# Patient Record
Sex: Female | Born: 1985 | Race: White | Hispanic: No | Smoking: Former smoker
Health system: Southern US, Community
[De-identification: ages and names within clinical notes are randomized; demographics above are authoritative.]

---

## 2015-05-18 ENCOUNTER — Emergency Department (HOSPITAL_COMMUNITY): Payer: No Typology Code available for payment source

## 2015-05-18 ENCOUNTER — Emergency Department (HOSPITAL_COMMUNITY)
Admission: EM | Admit: 2015-05-18 | Discharge: 2015-05-18 | Disposition: A | Discharge status: Home / Self Care | Payer: No Typology Code available for payment source | Attending: Emergency Medicine | Admitting: Emergency Medicine

## 2015-05-18 ENCOUNTER — Encounter (HOSPITAL_COMMUNITY): Payer: Self-pay | Admitting: Emergency Medicine

## 2015-05-18 DIAGNOSIS — S62625A Displaced fracture of medial phalanx of left ring finger, initial encounter for closed fracture: Secondary | ICD-10-CM | POA: Diagnosis not present

## 2015-05-18 DIAGNOSIS — Y998 Other external cause status: Secondary | ICD-10-CM | POA: Insufficient documentation

## 2015-05-18 DIAGNOSIS — Z79899 Other long term (current) drug therapy: Secondary | ICD-10-CM | POA: Insufficient documentation

## 2015-05-18 DIAGNOSIS — Y92410 Unspecified street and highway as the place of occurrence of the external cause: Secondary | ICD-10-CM | POA: Insufficient documentation

## 2015-05-18 DIAGNOSIS — S62609A Fracture of unspecified phalanx of unspecified finger, initial encounter for closed fracture: Secondary | ICD-10-CM

## 2015-05-18 DIAGNOSIS — S50812A Abrasion of left forearm, initial encounter: Secondary | ICD-10-CM | POA: Insufficient documentation

## 2015-05-18 DIAGNOSIS — Z91040 Latex allergy status: Secondary | ICD-10-CM | POA: Insufficient documentation

## 2015-05-18 DIAGNOSIS — S29001A Unspecified injury of muscle and tendon of front wall of thorax, initial encounter: Secondary | ICD-10-CM | POA: Diagnosis not present

## 2015-05-18 DIAGNOSIS — S60042A Contusion of left ring finger without damage to nail, initial encounter: Secondary | ICD-10-CM | POA: Diagnosis not present

## 2015-05-18 DIAGNOSIS — Y9389 Activity, other specified: Secondary | ICD-10-CM | POA: Insufficient documentation

## 2015-05-18 DIAGNOSIS — S6992XA Unspecified injury of left wrist, hand and finger(s), initial encounter: Secondary | ICD-10-CM | POA: Diagnosis present

## 2015-05-18 MED ORDER — CYCLOBENZAPRINE HCL 10 MG PO TABS: 10.0000 mg | ORAL_TABLET | Freq: Two times a day (BID) | ORAL | Status: DC | PRN

## 2015-05-18 MED ORDER — OXYCODONE-ACETAMINOPHEN 5-325 MG PO TABS
1.0000 | ORAL_TABLET | ORAL | Status: AC | PRN
  Administered 2015-05-18 (×2): 1 via ORAL
  Filled 2015-05-18 (×2): qty 1

## 2015-05-18 MED ORDER — TRAMADOL HCL 50 MG PO TABS: 50.0000 mg | ORAL_TABLET | Freq: Four times a day (QID) | ORAL | Status: DC | PRN

## 2015-05-18 NOTE — Discharge Instructions (Signed)
Please return immediately if you experience any worsening signs or symptoms, please follow up with hand surgery for reevaluation and further management.  Finger Fracture Fractures of fingers are breaks in the bones of the fingers. There are many types of fractures. There are different ways of treating these fractures. Your health care provider will discuss the best way to treat your fracture. CAUSES Traumatic injury is the main cause of broken fingers. These include:  Injuries while playing sports.  Workplace injuries.  Falls. RISK FACTORS Activities that can increase your risk of finger fractures include:  Sports.  Workplace activities that involve machinery.  A condition called osteoporosis, which can make your bones less dense and cause them to fracture more easily. SIGNS AND SYMPTOMS The main symptoms of a broken finger are pain and swelling within 15 minutes after the injury. Other symptoms include:  Bruising of your finger.  Stiffness of your finger.  Numbness of your finger.  Exposed bones (compound fracture) if the fracture is severe. DIAGNOSIS  The best way to diagnose a broken bone is with X-ray imaging. Additionally, your health care provider will use this X-ray image to evaluate the position of the broken finger bones.  TREATMENT  Finger fractures can be treated with:   Nonreduction--This means the bones are in place. The finger is splinted without changing the positions of the bone pieces. The splint is usually left on for about a week to 10 days. This will depend on your fracture and what your health care provider thinks.  Closed reduction--The bones are put back into position without using surgery. The finger is then splinted.  Open reduction and internal fixation--The fracture site is opened. Then the bone pieces are fixed into place with pins or some type of hardware. This is seldom required. It depends on the severity of the fracture. HOME CARE INSTRUCTIONS     Follow your health care provider's instructions regarding activities, exercises, and physical therapy.  Only take over-the-counter or prescription medicines for pain, discomfort, or fever as directed by your health care provider. SEEK MEDICAL CARE IF: You have pain or swelling that limits the motion or use of your fingers. SEEK IMMEDIATE MEDICAL CARE IF:  Your finger becomes numb. MAKE SURE YOU:   Understand these instructions.  Will watch your condition.  Will get help right away if you are not doing well or get worse.   This information is not intended to replace advice given to you by your health care provider. Make sure you discuss any questions you have with your health care provider.   Document Released: 04/21/2000 Document Revised: 10/28/2012 Document Reviewed: 08/19/2012 Elsevier Interactive Patient Education [employer]2016 [employer] Inc.
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
## 2015-08-02 DIAGNOSIS — S62655D Nondisplaced fracture of medial phalanx of left ring finger, subsequent encounter for fracture with routine healing: Secondary | ICD-10-CM | POA: Insufficient documentation

## 2016-04-18 ENCOUNTER — Ambulatory Visit: Payer: Self-pay | Admitting: Adult Health

## 2016-04-20 ENCOUNTER — Emergency Department (HOSPITAL_COMMUNITY)
Admission: EM | Admit: 2016-04-20 | Discharge: 2016-04-20 | Disposition: A | Discharge status: Home / Self Care | Payer: Managed Care, Other (non HMO) | Attending: Emergency Medicine | Admitting: Emergency Medicine

## 2016-04-20 ENCOUNTER — Encounter (HOSPITAL_COMMUNITY): Payer: Self-pay

## 2016-04-20 ENCOUNTER — Emergency Department (HOSPITAL_COMMUNITY): Payer: Managed Care, Other (non HMO)

## 2016-04-20 DIAGNOSIS — X58XXXA Exposure to other specified factors, initial encounter: Secondary | ICD-10-CM | POA: Insufficient documentation

## 2016-04-20 DIAGNOSIS — Z91040 Latex allergy status: Secondary | ICD-10-CM | POA: Insufficient documentation

## 2016-04-20 DIAGNOSIS — Z7982 Long term (current) use of aspirin: Secondary | ICD-10-CM | POA: Insufficient documentation

## 2016-04-20 DIAGNOSIS — Y999 Unspecified external cause status: Secondary | ICD-10-CM | POA: Diagnosis not present

## 2016-04-20 DIAGNOSIS — Z79899 Other long term (current) drug therapy: Secondary | ICD-10-CM | POA: Insufficient documentation

## 2016-04-20 DIAGNOSIS — Y929 Unspecified place or not applicable: Secondary | ICD-10-CM | POA: Diagnosis not present

## 2016-04-20 DIAGNOSIS — Y939 Activity, unspecified: Secondary | ICD-10-CM | POA: Insufficient documentation

## 2016-04-20 DIAGNOSIS — S39012A Strain of muscle, fascia and tendon of lower back, initial encounter: Secondary | ICD-10-CM | POA: Diagnosis not present

## 2016-04-20 DIAGNOSIS — S3992XA Unspecified injury of lower back, initial encounter: Secondary | ICD-10-CM | POA: Diagnosis present

## 2016-04-20 MED ORDER — CYCLOBENZAPRINE HCL 10 MG PO TABS: 10.0000 mg | ORAL_TABLET | Freq: Two times a day (BID) | ORAL | 0 refills | Status: DC | PRN

## 2016-04-20 MED ORDER — METHOCARBAMOL 500 MG PO TABS
500.0000 mg | ORAL_TABLET | Freq: Once | ORAL | Status: AC
Start: 2016-04-20 — End: 2016-04-20
  Administered 2016-04-20: 500 mg via ORAL
  Filled 2016-04-20: qty 1

## 2016-04-20 MED ORDER — KETOROLAC TROMETHAMINE 60 MG/2ML IM SOLN
30.0000 mg | Freq: Once | INTRAMUSCULAR | Status: AC
  Administered 2016-04-20: 30 mg via INTRAMUSCULAR
  Filled 2016-04-20: qty 2

## 2016-04-20 MED ORDER — LIDOCAINE 5 % EX PTCH: 1.0000 | MEDICATED_PATCH | CUTANEOUS | 0 refills | Status: DC

## 2016-04-20 MED ORDER — DICLOFENAC SODIUM 50 MG PO TBEC: 50.0000 mg | DELAYED_RELEASE_TABLET | Freq: Two times a day (BID) | ORAL | 0 refills | Status: DC

## 2016-04-20 MED ORDER — TRAMADOL HCL 50 MG PO TABS
50.0000 mg | ORAL_TABLET | Freq: Four times a day (QID) | ORAL | 0 refills | Status: DC | PRN
Start: 2016-04-20 — End: 2016-06-27

## 2016-04-20 MED ORDER — OXYCODONE-ACETAMINOPHEN 5-325 MG PO TABS
1.0000 | ORAL_TABLET | Freq: Once | ORAL | Status: AC
  Administered 2016-04-20: 1 via ORAL
  Filled 2016-04-20: qty 1

## 2016-04-20 NOTE — Discharge Instructions (Signed)
Do not take the narcotic or muscle relaxant if driving because they will make you sleepy. Follow up with Dr. Eulah Pont. Return here as needed.

## 2016-04-20 NOTE — ED Triage Notes (Signed)
She states she felt sudden low back pain upon bending to look into a cabinet this morning. She states she is otherwise healthy.
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
## 2016-04-23 ENCOUNTER — Other Ambulatory Visit: Payer: Self-pay

## 2016-04-24 ENCOUNTER — Encounter: Payer: Self-pay | Admitting: Adult Health

## 2016-04-24 ENCOUNTER — Ambulatory Visit (INDEPENDENT_AMBULATORY_CARE_PROVIDER_SITE_OTHER): Payer: Managed Care, Other (non HMO) | Admitting: Adult Health

## 2016-04-24 VITALS — BP 133/71 | HR 67 | Ht 67.75 in | Wt >= 6400 oz

## 2016-04-24 DIAGNOSIS — L720 Epidermal cyst: Secondary | ICD-10-CM | POA: Diagnosis not present

## 2016-04-24 DIAGNOSIS — M549 Dorsalgia, unspecified: Secondary | ICD-10-CM | POA: Insufficient documentation

## 2016-04-24 DIAGNOSIS — R5383 Other fatigue: Secondary | ICD-10-CM | POA: Diagnosis not present

## 2016-04-24 DIAGNOSIS — Z0000 Encounter for general adult medical examination without abnormal findings: Secondary | ICD-10-CM | POA: Diagnosis not present

## 2016-04-24 DIAGNOSIS — M545 Low back pain: Secondary | ICD-10-CM | POA: Diagnosis not present

## 2016-04-24 NOTE — Assessment & Plan Note (Signed)
Has lost 8 lbs in the last 6 weeks due to reduction of sugar/saturated fat in diet. Current wt today 400# Slowly increase regular exercise into daily routine.

## 2016-04-24 NOTE — Assessment & Plan Note (Signed)
Continue daily stretching. Continue all treatment as directed by ED. Continue with Chiropractor. If pain persists/worsens, please call us for Orthopedic Referral.

## 2016-04-24 NOTE — Assessment & Plan Note (Signed)
Dermatology referral placed

## 2016-04-24 NOTE — Progress Notes (Signed)
Subjective:    Patient ID: Sandy Christensen, female    DOB: January 19, 1986, 31 y.o.   MRN: [id number]  HPI:  Sandy Christensen is here to establish as a new pt.  She is a very pleasant 31 year old female.  PMH:  Mobid Obesity, Cyst on left ear (ear pierced 2012, then cyst developed), chronic bil heel pain, and new onset acute back pain.  She was treated in ED over weekend for back pain and reports pain is 2/10 at present.  She has been taking medication as directed and plans on visiting personal chiropractor ASAP. Heel pain has been present "forever and hurts when I first walk in morning or after sitting for prolonged periods".  She recently eliminated soda from diet and reducing saturated fat/sugar/CHO intake.  She plans on increasing daily exercise after back pain resolves.  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 Her partner was at [facility] during encounter.   Patient Care Team    Relationship Specialty Notifications Start End  Malon Kindle, NP PCP - General Family Medicine  04/24/16     Patient Active Problem List   Diagnosis Date Noted  . Closed nondisplaced fracture of middle phalanx of left ring finger with routine healing 08/02/2015     No past medical history on file.   No past surgical history on file.   Family History  Problem Relation Age of Onset  . Hypertension Mother   . Cancer Father     skin  . Healthy Sister   . Healthy Brother   . Healthy Sister   . Healthy Sister   . Healthy Brother      History  Drug Use No     History  Alcohol Use  . Yes    Comment: socially     History  Smoking Status  . Former Smoker  . Packs/day: 0.50  . Years: 2.00  . Types: Cigarettes  . Quit date: 01/21/1998  Smokeless Tobacco  . Never Used     Outpatient Encounter Prescriptions as of 04/24/2016  Medication Sig  . Ascorbic Acid (VITAMIN C PO) Take 1 tablet by mouth daily.  Marland Kitchen aspirin-acetaminophen-caffeine (EXCEDRIN MIGRAINE)  250-250-65 MG tablet Take 2 tablets by mouth every 6 (six) hours as needed for headache.  . cetirizine (ZYRTEC) 10 MG tablet Take 10 mg by mouth daily.  . cyclobenzaprine (FLEXERIL) 10 MG tablet Take 1 tablet (10 mg total) by mouth 2 (two) times daily as needed for muscle spasms.  . diclofenac (VOLTAREN) 50 MG EC tablet Take 1 tablet (50 mg total) by mouth 2 (two) times daily.  . fluticasone (FLONASE) 50 MCG/ACT nasal spray Place 1 spray into both nostrils daily as needed for allergies or rhinitis.  Marland Kitchen lidocaine (LIDODERM) 5 % Place 1 patch onto the skin daily. Remove & Discard patch within 12 hours or as directed by MD  . Multiple Vitamins-Minerals (ECHINACEA ACZ PO) Take 1 tablet by mouth daily.  . polyvinyl alcohol (LIQUIFILM TEARS) 1.4 % ophthalmic solution Place 1 drop into both eyes 3 (three) times daily.  . RaNITidine HCl (ZANTAC PO) Take 1 tablet by mouth daily as needed (indigestion.).  Marland Kitchen traMADol (ULTRAM) 50 MG tablet Take 1 tablet (50 mg total) by mouth every 6 (six) hours as needed.   No facility-administered encounter medications on file as of 04/24/2016.     Allergies: Latex and Neosporin [neomycin-bacitracin zn-polymyx]  Body mass index is  61.3 kg/m.  Blood pressure 133/71, pulse 67, height 5' 7.75" (1.721 m), weight (!) 400 lb 3.2 oz (181.5 kg), last menstrual period 04/06/2016.   Review of Systems  Constitutional: Positive for fatigue. Negative for activity change, appetite change, chills, diaphoresis, fever and unexpected weight change.  HENT: Negative for congestion.   Eyes: Negative for visual disturbance.  Respiratory: Negative for cough, chest tightness, shortness of breath, wheezing and stridor.   Cardiovascular: Negative for chest pain, palpitations and leg swelling.  Gastrointestinal: Negative for abdominal distention, abdominal pain, blood in stool, constipation, diarrhea, nausea and vomiting.  Endocrine: Negative for cold intolerance, heat intolerance,  polydipsia, polyphagia and polyuria.  Genitourinary: Negative for difficulty urinating, flank pain and hematuria.  Musculoskeletal: Positive for arthralgias, back pain, gait problem, joint swelling and myalgias. Negative for neck pain and neck stiffness.       Denies bowel/bladder dysfunction. Denies numbness/tingling/change in sensation in lower extremities.   Skin: Negative for color change, pallor, rash and wound.  Allergic/Immunologic: Negative for immunocompromised state.  Neurological: Positive for headaches. Negative for dizziness, tremors, weakness and light-headedness.  Hematological: Does not bruise/bleed easily.  Psychiatric/Behavioral: Negative for agitation, dysphoric mood and sleep disturbance. The patient is not nervous/anxious.        Objective:   Physical Exam  Constitutional: She is oriented to person, place, and time. She appears well-developed and well-nourished. No distress.  HENT:  Head: Normocephalic and atraumatic.  Right Ear: Hearing, tympanic membrane and external ear normal.  Left Ear: Hearing and tympanic membrane normal.  Ears:  One epidermal cyst on top of left helix.   Eyes: Conjunctivae are normal. Pupils are equal, round, and reactive to light.  Neck: Normal range of motion. Neck supple.  Cardiovascular: Normal rate, regular rhythm, normal heart sounds and intact distal pulses.   No murmur heard. Pulmonary/Chest: Effort normal and breath sounds normal. No respiratory distress. She has no wheezes. She has no rales. She exhibits no tenderness.  Abdominal: Soft. Bowel sounds are normal. She exhibits no distension and no mass. There is no tenderness. There is no rebound and no guarding.  Protuberant abdomen.  Musculoskeletal: Normal range of motion. She exhibits edema and tenderness. She exhibits no deformity.       Right ankle: She exhibits swelling.       Left ankle: She exhibits swelling.       Lumbar back: She exhibits tenderness and spasm. She  exhibits normal range of motion, no swelling, no edema and no deformity.  Lymphadenopathy:    She has no cervical adenopathy.  Neurological: She is alert and oriented to person, place, and time. Coordination normal.  Skin: Skin is warm and dry. No rash noted. She is not diaphoretic. No erythema. No pallor.  Psychiatric: She has a normal mood and affect. Her behavior is normal. Judgment and thought content normal.  Nursing note and vitals reviewed.         Assessment & Plan:   1. Epidermal cyst of ear   2. Morbid obesity (HCC)   3. Acute bilateral low back pain, with sciatica presence unspecified   4. Health care maintenance     Back pain Continue daily stretching. Continue all treatment as directed by ED. Continue with Chiropractor. If pain persists/worsens, please call us for Orthopedic Referral.  Morbid obesity (HCC) Has lost 8 lbs in the last 6 weeks due to reduction of sugar/saturated fat in diet. Current wt today 400# Slowly increase regular exercise into daily routine.   Epidermal cyst  of ear Dermatology referral placed.  Health care maintenance Continue all medications as directed. Continue to improve diet-GREAT JOB! Increase regular movement, recommend at least 63mins/5 times week. Please schedule fasting lab nurse visit at your convenience. Follow-up in 6 months, sooner if needed    FOLLOW-UP:  Return in about 6 months (around 10/24/2016) for Regular Follow Up.

## 2016-04-24 NOTE — Assessment & Plan Note (Signed)
Continue all medications as directed. Continue to improve diet-GREAT JOB! Increase regular movement, recommend at least 52mins/5 times week. Please schedule fasting lab nurse visit at your convenience. Follow-up in 6 months, sooner if needed

## 2016-04-24 NOTE — Patient Instructions (Signed)
Heart-Healthy Eating Plan Many factors influence your heart health, including eating and exercise habits. Heart (coronary) risk increases with abnormal blood fat (lipid) levels. Heart-healthy meal planning includes limiting unhealthy fats, increasing healthy fats, and making other small dietary changes. This includes maintaining a healthy body weight to help keep lipid levels within a normal range. What is my plan? Your health care provider recommends that you:  Get no more than _________% of the total calories in your daily diet from fat.  Limit your intake of saturated fat to less than _________% of your total calories each day.  Limit the amount of cholesterol in your diet to less than _________ mg per day. What types of fat should I choose?  Choose healthy fats more often. Choose monounsaturated and polyunsaturated fats, such as olive oil and canola oil, flaxseeds, walnuts, almonds, and seeds.  Eat more omega-3 fats. Good choices include salmon, mackerel, sardines, tuna, flaxseed oil, and ground flaxseeds. Aim to eat fish at least two times each week.  Limit saturated fats. Saturated fats are primarily found in animal products, such as meats, butter, and cream. Plant sources of saturated fats include palm oil, palm kernel oil, and coconut oil.  Avoid foods with partially hydrogenated oils in them. These contain trans fats. Examples of foods that contain trans fats are stick margarine, some tub margarines, cookies, crackers, and other baked goods. What general guidelines do I need to follow?  Check food labels carefully to identify foods with trans fats or high amounts of saturated fat.  Fill one half of your plate with vegetables and green salads. Eat 4-5 servings of vegetables per day. A serving of vegetables equals 1 cup of raw leafy vegetables,  cup of raw or cooked cut-up vegetables, or  cup of vegetable juice.  Fill one fourth of your plate with whole grains. Look for the word  "whole" as the first word in the ingredient list.  Fill one fourth of your plate with lean protein foods.  Eat 4-5 servings of fruit per day. A serving of fruit equals one medium whole fruit,  cup of dried fruit,  cup of fresh, frozen, or canned fruit, or  cup of 100% fruit juice.  Eat more foods that contain soluble fiber. Examples of foods that contain this type of fiber are apples, broccoli, carrots, beans, peas, and barley. Aim to get 20-30 g of fiber per day.  Eat more home-cooked food and less restaurant, buffet, and fast food.  Limit or avoid alcohol.  Limit foods that are high in starch and sugar.  Avoid fried foods.  Cook foods by using methods other than frying. Baking, boiling, grilling, and broiling are all great options. Other fat-reducing suggestions include:  Removing the skin from poultry.  Removing all visible fats from meats.  Skimming the fat off of stews, soups, and gravies before serving them.  Steaming vegetables in water or broth.  Lose weight if you are overweight. Losing just 5-10% of your initial body weight can help your overall health and prevent diseases such as diabetes and heart disease.  Increase your consumption of nuts, legumes, and seeds to 4-5 servings per week. One serving of dried beans or legumes equals  cup after being cooked, one serving of nuts equals 1 ounces, and one serving of seeds equals  ounce or 1 tablespoon.  You may need to monitor your salt (sodium) intake, especially if you have high blood pressure. Talk with your health care provider or dietitian to get more  information about reducing sodium. What foods can I eat? Grains   Breads, including [place], white, pita, wheat, raisin, rye, oatmeal, and [place]. Tortillas that are neither fried nor made with lard or trans fat. Low-fat rolls, including hotdog and hamburger buns and English muffins. Biscuits. Muffins. Waffles. Pancakes. Light popcorn. Whole-grain cereals. Flatbread.  Melba toast. Pretzels. Breadsticks. Rusks. Low-fat snacks and crackers, including oyster, saltine, matzo, graham, animal, and rye. Rice and pasta, including [place] rice and those that are made with whole wheat. Vegetables  All vegetables. Fruits  All fruits, but limit coconut. Meats and Other Protein Sources  Lean, well-trimmed beef, veal, pork, and lamb. Chicken and [place] without skin. All fish and shellfish. Wild duck, rabbit, pheasant, and venison. Egg whites or low-cholesterol egg substitutes. Dried beans, peas, lentils, and tofu.Seeds and most nuts. Dairy  Low-fat or nonfat cheeses, including ricotta, string, and mozzarella. Skim or 1% milk that is liquid, powdered, or evaporated. Buttermilk that is made with low-fat milk. Nonfat or low-fat yogurt. Beverages  Mineral water. Diet carbonated beverages. Sweets and Desserts  Sherbets and fruit ices. Honey, jam, marmalade, jelly, and syrups. Meringues and gelatins. Pure sugar candy, such as hard candy, jelly beans, gumdrops, mints, marshmallows, and small amounts of dark chocolate. [employer] Eat all sweets and desserts in moderation. Fats and Oils  Nonhydrogenated (trans-free) margarines. Vegetable oils, including soybean, sesame, sunflower, olive, peanut, safflower, corn, canola, and cottonseed. Salad dressings or mayonnaise that are made with a vegetable oil. Limit added fats and oils that you use for cooking, baking, salads, and as spreads. Other  Cocoa powder. Coffee and tea. All seasonings and condiments. The items listed above may not be a complete list of recommended foods or beverages. Contact your dietitian for more options.  What foods are not recommended? Grains  Breads that are made with saturated or trans fats, oils, or whole milk. Croissants. Butter rolls. Cheese breads. Sweet rolls. Donuts. Buttered popcorn. Chow mein noodles. High-fat crackers, such as cheese or butter crackers. Meats and Other Protein Sources  Fatty  meats, such as hotdogs, short ribs, sausage, spareribs, bacon, ribeye roast or steak, and mutton. High-fat deli meats, such as salami and bologna. Caviar. Domestic duck and goose. Organ meats, such as kidney, liver, sweetbreads, brains, gizzard, chitterlings, and heart. Dairy  Cream, sour cream, cream cheese, and creamed cottage cheese. Whole milk cheeses, including blue (bleu), Monterey Jack, [place], [place], American, [place], Swiss, [place], [place], and [place]. Whole or 2% milk that is liquid, evaporated, or condensed. Whole buttermilk. Cream sauce or high-fat cheese sauce. Yogurt that is made from whole milk. Beverages  Regular sodas and drinks with added sugar. Sweets and Desserts  Frosting. Pudding. Cookies. Cakes other than angel food cake. Candy that has milk chocolate or white chocolate, hydrogenated fat, butter, coconut, or unknown ingredients. Buttered syrups. Full-fat ice cream or ice cream drinks. Fats and Oils  Gravy that has suet, meat fat, or shortening. Cocoa butter, hydrogenated oils, palm oil, coconut oil, palm kernel oil. These can often be found in baked products, candy, fried foods, nondairy creamers, and whipped toppings. Solid fats and shortenings, including bacon fat, salt pork, lard, and butter. Nondairy cream substitutes, such as coffee creamers and sour cream substitutes. Salad dressings that are made of unknown oils, cheese, or sour cream. The items listed above may not be a complete list of foods and beverages to avoid. Contact your dietitian for more information.  This information is not intended to replace advice given to you by  your health care provider. Make sure you discuss any questions you have with your health care provider. Document Released: 10/17/2007 Document Revised: 07/28/2015 Document Reviewed: 07/01/2013 Elsevier Interactive Patient Education  2017 Elsevier Inc.   Back Pain, Adult Back pain is very common in adults.The cause of back pain is rarely  dangerous and the pain often gets better over time.The cause of your back pain may not be known. Some common causes of back pain include:  Strain of the muscles or ligaments supporting the spine.  Wear and tear (degeneration) of the spinal disks.  Arthritis.  Direct injury to the back. For many people, back pain may return. Since back pain is rarely dangerous, most people can learn to manage this condition on their own. Follow these instructions at home: Watch your back pain for any changes. The following actions may help to lessen any discomfort you are feeling:  Remain active. It is stressful on your back to sit or stand in one place for long periods of time. Do not sit, drive, or stand in one place for more than 30 minutes at a time. Take short walks on even surfaces as soon as you are able.Try to increase the length of time you walk each day.  Exercise regularly as directed by your health care provider. Exercise helps your back heal faster. It also helps avoid future injury by keeping your muscles strong and flexible.  Do not stay in bed.Resting more than 1-2 days can delay your recovery.  Pay attention to your body when you bend and lift. The most comfortable positions are those that put less stress on your recovering back. Always use proper lifting techniques, including:  Bending your knees.  Keeping the load close to your body.  Avoiding twisting.  Find a comfortable position to sleep. Use a firm mattress and lie on your side with your knees slightly bent. If you lie on your back, put a pillow under your knees.  Avoid feeling anxious or stressed.Stress increases muscle tension and can worsen back pain.It is important to recognize when you are anxious or stressed and learn ways to manage it, such as with exercise.  Take medicines only as directed by your health care provider. Over-the-counter medicines to reduce pain and inflammation are often the most helpful.Your health  care provider may prescribe muscle relaxant drugs.These medicines help dull your pain so you can more quickly return to your normal activities and healthy exercise.    Apply ice to the injured area:  Put ice in a plastic bag.  Place a towel between your skin and the bag.  Leave the ice on for 20 minutes, 2-3 times a day for the first 2-3 days. After that, ice and heat may be alternated to reduce pain and spasms.  Maintain a healthy weight. Excess weight puts extra stress on your back and makes it difficult to maintain good posture. Contact a health care provider if:  You have pain that is not relieved with rest or medicine.  You have increasing pain going down into the legs or buttocks.  You have pain that does not improve in one week.  You have night pain.  You lose weight.  You have a fever or chills. Get help right away if:  You develop new bowel or bladder control problems.  You have unusual weakness or numbness in your arms or legs.  You develop nausea or vomiting.  You develop abdominal pain.  You feel faint. This information is not intended to  replace advice given to you by your health care provider. Make sure you discuss any questions you have with your health care provider. Document Released: 01/07/2005 Document Revised: 05/18/2015 Document Reviewed: 05/11/2013  Elsevier Interactive Patient Education  2017 [employer].  Continue all medications as directed. Continue to improve diet-GREAT JOB! Increase regular movement, recommend at least 41mins/5 times week. Please schedule fasting lab nurse visit at your convenience. Follow-up in 6 months, sooner if needed.

## 2016-05-31 ENCOUNTER — Other Ambulatory Visit (INDEPENDENT_AMBULATORY_CARE_PROVIDER_SITE_OTHER): Payer: Managed Care, Other (non HMO)

## 2016-05-31 DIAGNOSIS — R5383 Other fatigue: Secondary | ICD-10-CM

## 2016-06-01 LAB — COMPREHENSIVE METABOLIC PANEL
A/G RATIO: 1.4 (ref 1.2–2.2)
ALT: 21 IU/L (ref 0–32)
AST: 19 IU/L (ref 0–40)
Albumin: 3.8 g/dL (ref 3.5–5.5)
Alkaline Phosphatase: 76 IU/L (ref 39–117)
BUN/Creatinine Ratio: 20 (ref 9–23)
BUN: 15 mg/dL (ref 6–20)
Bilirubin Total: 0.4 mg/dL (ref 0.0–1.2)
CO2: 23 mmol/L (ref 18–29)
CREATININE: 0.76 mg/dL (ref 0.57–1.00)
Calcium: 9 mg/dL (ref 8.7–10.2)
Chloride: 104 mmol/L (ref 96–106)
GFR, EST AFRICAN AMERICAN: 123 mL/min/{1.73_m2} (ref >59)
GFR, EST NON AFRICAN AMERICAN: 106 mL/min/{1.73_m2} (ref >59)
GLOBULIN, TOTAL: 2.7 g/dL (ref 1.5–4.5)
Glucose: 95 mg/dL (ref 65–99)
POTASSIUM: 4.6 mmol/L (ref 3.5–5.2)
SODIUM: 143 mmol/L (ref 134–144)
TOTAL PROTEIN: 6.5 g/dL (ref 6.0–8.5)

## 2016-06-01 LAB — LIPID PANEL
CHOLESTEROL TOTAL: 154 mg/dL (ref 100–199)
Chol/HDL Ratio: 4.3 ratio (ref 0.0–4.4)
HDL: 36 mg/dL — AB (ref >39)
LDL CALC: 104 mg/dL — AB (ref 0–99)
Triglycerides: 70 mg/dL (ref 0–149)
VLDL CHOLESTEROL CAL: 14 mg/dL (ref 5–40)

## 2016-06-01 LAB — CBC WITH DIFFERENTIAL/PLATELET
BASOS ABS: 0 10*3/uL (ref 0.0–0.2)
Basos: 0 %
EOS (ABSOLUTE): 0.1 10*3/uL (ref 0.0–0.4)
EOS: 1 %
HEMOGLOBIN: 12.8 g/dL (ref 11.1–15.9)
Hematocrit: 38.5 % (ref 34.0–46.6)
Immature Grans (Abs): 0 10*3/uL (ref 0.0–0.1)
Immature Granulocytes: 0 %
LYMPHS ABS: 2.1 10*3/uL (ref 0.7–3.1)
LYMPHS: 28 %
MCH: 29.1 pg (ref 26.6–33.0)
MCHC: 33.2 g/dL (ref 31.5–35.7)
MCV: 88 fL (ref 79–97)
MONOCYTES: 8 %
MONOS ABS: 0.6 10*3/uL (ref 0.1–0.9)
NEUTROS PCT: 63 %
Neutrophils Absolute: 4.6 10*3/uL (ref 1.4–7.0)
Platelets: 294 10*3/uL (ref 150–379)
RBC: 4.4 x10E6/uL (ref 3.77–5.28)
RDW: 14.2 % (ref 12.3–15.4)
WBC: 7.4 10*3/uL (ref 3.4–10.8)

## 2016-06-01 LAB — TSH: TSH: 1.39 u[IU]/mL (ref 0.450–4.500)

## 2016-06-01 LAB — VITAMIN D 25 HYDROXY (VIT D DEFICIENCY, FRACTURES): VIT D 25 HYDROXY: 29 ng/mL — AB (ref 30.0–100.0)

## 2016-06-01 LAB — HEMOGLOBIN A1C
ESTIMATED AVERAGE GLUCOSE: 117 mg/dL
Hgb A1c MFr Bld: 5.7 % — ABNORMAL HIGH (ref 4.8–5.6)

## 2016-06-27 ENCOUNTER — Encounter: Payer: Self-pay | Admitting: Adult Health

## 2016-06-27 ENCOUNTER — Ambulatory Visit (INDEPENDENT_AMBULATORY_CARE_PROVIDER_SITE_OTHER): Payer: Managed Care, Other (non HMO) | Admitting: Adult Health

## 2016-06-27 VITALS — BP 121/78 | HR 80 | Ht 67.75 in | Wt 388.0 lb

## 2016-06-27 DIAGNOSIS — G5601 Carpal tunnel syndrome, right upper limb: Secondary | ICD-10-CM

## 2016-06-27 DIAGNOSIS — Z0000 Encounter for general adult medical examination without abnormal findings: Secondary | ICD-10-CM

## 2016-06-27 DIAGNOSIS — N644 Mastodynia: Secondary | ICD-10-CM | POA: Diagnosis not present

## 2016-06-27 MED ORDER — PHENTERMINE HCL 37.5 MG PO TABS: 37.5000 mg | ORAL_TABLET | Freq: Every day | ORAL | 0 refills | Status: DC

## 2016-06-27 MED ORDER — OMEPRAZOLE 20 MG PO CPDR: 20.0000 mg | DELAYED_RELEASE_CAPSULE | Freq: Every day | ORAL | 3 refills | Status: DC
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
## 2016-06-27 NOTE — Assessment & Plan Note (Signed)
12 lb wt loss since last OV achieved by healthy eating and regular movement-GREAT JOB! BP at goal and no hx of cardiac disease. Started on Phentermine 37.5mg . Required f/u every 4 weeks for BP and wt check. Goal wt 200 lbs.

## 2016-06-27 NOTE — Patient Instructions (Signed)
Exercising to Lose Weight Exercising can help you to lose weight. In order to lose weight through exercise, you need to do vigorous-intensity exercise. You can tell that you are exercising with vigorous intensity if you are breathing very hard and fast and cannot hold a conversation while exercising. Moderate-intensity exercise helps to maintain your current weight. You can tell that you are exercising at a moderate level if you have a higher heart rate and faster breathing, but you are still able to hold a conversation. How often should I exercise? Choose an activity that you enjoy and set realistic goals. Your health care provider can help you to make an activity plan that works for you. Exercise regularly as directed by your health care provider. This may include:  Doing resistance training twice each week, such as: ? Push-ups. ? Sit-ups. ? Lifting weights. ? Using resistance bands.  Doing a given intensity of exercise for a given amount of time. Choose from these options: ? 150 minutes of moderate-intensity exercise every week. ? 75 minutes of vigorous-intensity exercise every week. ? A mix of moderate-intensity and vigorous-intensity exercise every week.  Children, pregnant women, people who are out of shape, people who are overweight, and older adults may need to consult a health care provider for individual recommendations. If you have any sort of medical condition, be sure to consult your health care provider before starting a new exercise program. What are some activities that can help me to lose weight?  Walking at a rate of at least 4.5 miles an hour.  Jogging or running at a rate of 5 miles per hour.  Biking at a rate of at least 10 miles per hour.  Lap swimming.  Roller-skating or in-line skating.  Cross-country skiing.  Vigorous competitive sports, such as football, basketball, and soccer.  Jumping rope.  Aerobic dancing. How can I be more active in my day-to-day  activities?  Use the stairs instead of the elevator.  Take a walk during your lunch break.  If you drive, park your car farther away from work or school.  If you take public transportation, get off one stop early and walk the rest of the way.  Make all of your phone calls while standing up and walking around.  Get up, stretch, and walk around every 30 minutes throughout the day. What guidelines should I follow while exercising?  Do not exercise so much that you hurt yourself, feel dizzy, or get very short of breath.  Consult your health care provider prior to starting a new exercise program.  Wear comfortable clothes and shoes with good support.  Drink plenty of water while you exercise to prevent dehydration or heat stroke. Body water is lost during exercise and must be replaced.  Work out until you breathe faster and your heart beats faster. This information is not intended to replace advice given to you by your health care provider. Make sure you discuss any questions you have with your health care provider. Document Released: 02/09/2010 Document Revised: 06/15/2015 Document Reviewed: 06/10/2013 Elsevier Interactive Patient Education  2018 [employer].   Heart-Healthy Eating Plan Many factors influence your heart health, including eating and exercise habits. Heart (coronary) risk increases with abnormal blood fat (lipid) levels. Heart-healthy meal planning includes limiting unhealthy fats, increasing healthy fats, and making other small dietary changes. This includes maintaining a healthy body weight to help keep lipid levels within a normal range. What is my plan? Your health care provider recommends that you:  Get no more than _________% of the total calories in your daily diet from fat.  Limit your intake of saturated fat to less than _________% of your total calories each day.  Limit the amount of cholesterol in your diet to less than _________ mg per day.  What types  of fat should I choose?  Choose healthy fats more often. Choose monounsaturated and polyunsaturated fats, such as olive oil and canola oil, flaxseeds, walnuts, almonds, and seeds.  Eat more omega-3 fats. Good choices include salmon, mackerel, sardines, tuna, flaxseed oil, and ground flaxseeds. Aim to eat fish at least two times each week.  Limit saturated fats. Saturated fats are primarily found in animal products, such as meats, butter, and cream. Plant sources of saturated fats include palm oil, palm kernel oil, and coconut oil.  Avoid foods with partially hydrogenated oils in them. These contain trans fats. Examples of foods that contain trans fats are stick margarine, some tub margarines, cookies, crackers, and other baked goods. What general guidelines do I need to follow?  Check food labels carefully to identify foods with trans fats or high amounts of saturated fat.  Fill one half of your plate with vegetables and green salads. Eat 4-5 servings of vegetables per day. A serving of vegetables equals 1 cup of raw leafy vegetables,  cup of raw or cooked cut-up vegetables, or  cup of vegetable juice.  Fill one fourth of your plate with whole grains. Look for the word "whole" as the first word in the ingredient list.  Fill one fourth of your plate with lean protein foods.  Eat 4-5 servings of fruit per day. A serving of fruit equals one medium whole fruit,  cup of dried fruit,  cup of fresh, frozen, or canned fruit, or  cup of 100% fruit juice.  Eat more foods that contain soluble fiber. Examples of foods that contain this type of fiber are apples, broccoli, carrots, beans, peas, and barley. Aim to get 20-30 g of fiber per day.  Eat more home-cooked food and less restaurant, buffet, and fast food.  Limit or avoid alcohol.  Limit foods that are high in starch and sugar.  Avoid fried foods.  Cook foods by using methods other than frying. Baking, boiling, grilling, and broiling are  all great options. Other fat-reducing suggestions include: ? Removing the skin from poultry. ? Removing all visible fats from meats. ? Skimming the fat off of stews, soups, and gravies before serving them. ? Steaming vegetables in water or broth.  Lose weight if you are overweight. Losing just 5-10% of your initial body weight can help your overall health and prevent diseases such as diabetes and heart disease.  Increase your consumption of nuts, legumes, and seeds to 4-5 servings per week. One serving of dried beans or legumes equals  cup after being cooked, one serving of nuts equals 1 ounces, and one serving of seeds equals  ounce or 1 tablespoon.  You may need to monitor your salt (sodium) intake, especially if you have high blood pressure. Talk with your health care provider or dietitian to get more information about reducing sodium. What foods can I eat? Grains  Breads, including [place], white, pita, wheat, raisin, rye, oatmeal, and [place]. Tortillas that are neither fried nor made with lard or trans fat. Low-fat rolls, including hotdog and hamburger buns and English muffins. Biscuits. Muffins. Waffles. Pancakes. Light popcorn. Whole-grain cereals. Flatbread. Melba toast. Pretzels. Breadsticks. Rusks. Low-fat snacks and crackers, including oyster, saltine, matzo,  graham, animal, and rye. Rice and pasta, including brown rice and those that are made with whole wheat. Vegetables All vegetables. Fruits All fruits, but limit coconut. Meats and Other Protein Sources Lean, well-trimmed beef, veal, pork, and lamb. Chicken and [place] without skin. All fish and shellfish. Wild duck, rabbit, pheasant, and venison. Egg whites or low-cholesterol egg substitutes. Dried beans, peas, lentils, and tofu.Seeds and most nuts. Dairy Low-fat or nonfat cheeses, including ricotta, string, and mozzarella. Skim or 1% milk that is liquid, powdered, or evaporated. Buttermilk that is made with low-fat milk.  Nonfat or low-fat yogurt. Beverages Mineral water. Diet carbonated beverages. Sweets and Desserts Sherbets and fruit ices. Honey, jam, marmalade, jelly, and syrups. Meringues and gelatins. Pure sugar candy, such as hard candy, jelly beans, gumdrops, mints, marshmallows, and small amounts of dark chocolate. MGM MIRAGEngel food cake. Eat all sweets and desserts in moderation. Fats and Oils Nonhydrogenated (trans-free) margarines. Vegetable oils, including soybean, sesame, sunflower, olive, peanut, safflower, corn, canola, and cottonseed. Salad dressings or mayonnaise that are made with a vegetable oil. Limit added fats and oils that you use for cooking, baking, salads, and as spreads. Other Cocoa powder. Coffee and tea. All seasonings and condiments. The items listed above may not be a complete list of recommended foods or beverages. Contact your dietitian for more options. What foods are not recommended? Grains Breads that are made with saturated or trans fats, oils, or whole milk. Croissants. Butter rolls. Cheese breads. Sweet rolls. Donuts. Buttered popcorn. Chow mein noodles. High-fat crackers, such as cheese or butter crackers. Meats and Other Protein Sources Fatty meats, such as hotdogs, short ribs, sausage, spareribs, bacon, ribeye roast or steak, and mutton. High-fat deli meats, such as salami and bologna. Caviar. Domestic duck and goose. Organ meats, such as kidney, liver, sweetbreads, brains, gizzard, chitterlings, and heart. Dairy Cream, sour cream, cream cheese, and creamed cottage cheese. Whole milk cheeses, including blue (bleu), [address], [place], [place], [address], [place], [address], [place], [place]embert, and [place]. Whole or 2% milk that is liquid, evaporated, or condensed. Whole buttermilk. Cream sauce or high-fat cheese sauce. Yogurt that is made from whole milk. Beverages Regular sodas and drinks with added sugar. Sweets and Desserts Frosting. Pudding. Cookies. Cakes other than angel food  cake. Candy that has milk chocolate or white chocolate, hydrogenated fat, butter, coconut, or unknown ingredients. Buttered syrups. Full-fat ice cream or ice cream drinks. Fats and Oils Gravy that has suet, meat fat, or shortening. Cocoa butter, hydrogenated oils, palm oil, coconut oil, palm kernel oil. These can often be found in baked products, candy, fried foods, nondairy creamers, and whipped toppings. Solid fats and shortenings, including bacon fat, salt pork, lard, and butter. Nondairy cream substitutes, such as coffee creamers and sour cream substitutes. Salad dressings that are made of unknown oils, cheese, or sour cream. The items listed above may not be a complete list of foods and beverages to avoid. Contact your dietitian for more information. This information is not intended to replace advice given to you by your health care provider. Make sure you discuss any questions you have with your health care provider. Document Released: 10/17/2007 Document Revised: 07/28/2015 Document Reviewed: 07/01/2013 Elsevier Interactive Patient Education  2017 Elsevier Inc.  Please take Phentermine as directed. Continue excellent hydration and follow heart healthy diet. Increase regular exercise-walking, YouTube exercise videos. Referral placed for GYN and Orthopedics. Please follow-up in 4 weeks. GREAT JOB ON THE WEIGHT LOSS!!!
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
## 2016-06-27 NOTE — Assessment & Plan Note (Signed)
OBGYN referral placed
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
## 2016-07-19 ENCOUNTER — Ambulatory Visit (INDEPENDENT_AMBULATORY_CARE_PROVIDER_SITE_OTHER): Payer: Managed Care, Other (non HMO) | Admitting: Orthopaedic Surgery

## 2016-07-19 ENCOUNTER — Encounter (INDEPENDENT_AMBULATORY_CARE_PROVIDER_SITE_OTHER): Payer: Self-pay | Admitting: Orthopaedic Surgery

## 2016-07-19 ENCOUNTER — Encounter: Payer: Self-pay | Admitting: Obstetrics and Gynecology

## 2016-07-19 ENCOUNTER — Ambulatory Visit (INDEPENDENT_AMBULATORY_CARE_PROVIDER_SITE_OTHER): Payer: Managed Care, Other (non HMO) | Admitting: Obstetrics and Gynecology

## 2016-07-19 VITALS — BP 112/62 | HR 76 | Resp 16 | Ht 67.75 in | Wt 375.0 lb

## 2016-07-19 DIAGNOSIS — G5601 Carpal tunnel syndrome, right upper limb: Secondary | ICD-10-CM

## 2016-07-19 DIAGNOSIS — N6310 Unspecified lump in the right breast, unspecified quadrant: Secondary | ICD-10-CM

## 2016-07-19 NOTE — Progress Notes (Signed)
Patient scheduled while in office for right breast ultrasound, [facility] with [facility]. Patient declined earlier appointments offered, scheduled for 08/01/16 arriving at 2:50pm for 3:10pm appointment. Patient is agreeable to date and time.
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
## 2016-07-19 NOTE — Progress Notes (Signed)
Office Visit Note   Patient: Sandy Christensen           Date of Birth: 15-Nov-1985           MRN: [id number] Visit Date: 07/19/2016              Requested by: Julaine Fusi, NP [address] [place], [place] [address] PCP: Julaine Fusi, NP   Assessment & Plan: Visit Diagnoses:  1. Right carpal tunnel syndrome     Plan: Overall impression is right carpal tunnel syndrome. Recommend daytime splitting as needed. Nerve conduction studies ordered. Follow-up after the studies  Follow-Up Instructions: No Follow-up on file.   Orders:  No orders of the defined types were placed in this encounter.  No orders of the defined types were placed in this encounter.     Procedures: No procedures performed   Clinical Data: No additional findings.   Subjective: Chief Complaint  Patient presents with  . Right Wrist - Pain    Right hand pain and burning and numbness for quite some time. She wakes up at night with severe pain. She has difficulty driving due to numbness. She denies any injuries. She is not dropping objects. Nighttime splints have helped.    Review of Systems  Constitutional: Negative.   HENT: Negative.   Eyes: Negative.   Respiratory: Negative.   Cardiovascular: Negative.   Endocrine: Negative.   Musculoskeletal: Negative.   Neurological: Negative.   Hematological: Negative.   Psychiatric/Behavioral: Negative.   All other systems reviewed and are negative.    Objective: Vital Signs: There were no vitals taken for this visit.  Physical Exam  Constitutional: She is oriented to person, place, and time. She appears well-developed and well-nourished.  HENT:  Head: Normocephalic and atraumatic.  Eyes: EOM are normal.  Neck: Neck supple.  Pulmonary/Chest: Effort normal.  Abdominal: Soft.  Neurological: She is alert and oriented to person, place, and time.  Skin: Skin is warm. Capillary refill takes less than 2 seconds.  Psychiatric: She has a normal  mood and affect. Her behavior is normal. Judgment and thought content normal.  Nursing note and vitals reviewed.   Ortho Exam Right hand exam shows no muscular atrophy. Negative carpal tunnel compression signs. APB motor function is normal. Specialty Comments:  No specialty comments available.  Imaging: No results found.   PMFS History: Patient Active Problem List   Diagnosis Date Noted  . Breast pain 06/27/2016  . Acute carpal tunnel syndrome of right wrist 06/27/2016  . Morbid obesity (HCC) 04/24/2016  . Back pain 04/24/2016  . Epidermal cyst of ear 04/24/2016  . Health care maintenance 04/24/2016  . Closed nondisplaced fracture of middle phalanx of left ring finger with routine healing 08/02/2015   No past medical history on file.  Family History  Problem Relation Age of Onset  . Hypertension Mother   . Cancer Father        skin  . Healthy Sister   . Healthy Brother   . Healthy Sister   . Healthy Sister   . Healthy Brother     No past surgical history on file. Social History   Occupational History  . Not on file.   Social History Main Topics  . Smoking status: Former Smoker    Packs/day: 0.50    Years: 2.00    Types: Cigarettes    Quit date: 01/21/1998  . Smokeless tobacco: Never Used  . Alcohol use Yes     Comment:  socially  . Drug use: No  . Sexual activity: Yes    Birth control/ protection: None

## 2016-07-19 NOTE — Addendum Note (Signed)
Addended by: Cherre HugerMAY, Cullen Lahaie E on: 07/19/2016 09:39 AM   Modules accepted: Orders

## 2016-07-30 ENCOUNTER — Encounter: Payer: Self-pay | Admitting: Adult Health

## 2016-07-30 ENCOUNTER — Ambulatory Visit (INDEPENDENT_AMBULATORY_CARE_PROVIDER_SITE_OTHER): Payer: Managed Care, Other (non HMO) | Admitting: Adult Health

## 2016-07-30 MED ORDER — PHENTERMINE HCL 37.5 MG PO TABS: 37.5000 mg | ORAL_TABLET | Freq: Every day | ORAL | 0 refills | Status: DC

## 2016-07-30 NOTE — Progress Notes (Signed)
Subjective:    Patient ID: Sandy Christensen, female    DOB: 03-22-1985, 31 y.o.   MRN: [id number]  HPI:  Sandy Christensen is here for f/u:  Morbid Obesity-started on Phentermine 37.5mg  06/27/16.  She reports medication compliance and denies SE. Today's wt 376, which is 1 lb wt gain from last OV 06/27/16, however her clothes are fitting loser and her energy level and quality of sleep have both dramatically improved.  She reports eating a healthy breakfast (Mutli grain waffle with peanut butter, fresh fruit, water/milk), portion controlled lunch (lean chicken or large salad), and lean protein dinner.  She estimates to drink > gallon/water a day and denies drinking soda pop/ETOH.  She denies tobacco use.  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    Patient Care Team    Relationship Specialty Notifications Start End  Julaine Fusi, NP PCP - General Family Medicine  04/24/16     Patient Active Problem List   Diagnosis Date Noted  . Breast pain 06/27/2016  . Acute carpal tunnel syndrome of right wrist 06/27/2016  . Morbid obesity (HCC) 04/24/2016  . Back pain 04/24/2016  . Epidermal cyst of ear 04/24/2016  . Health care maintenance 04/24/2016  . Closed nondisplaced fracture of middle phalanx of left ring finger with routine healing 08/02/2015     No past medical history on file.   No past surgical history on file.   Family History  Problem Relation Age of Onset  . Hypertension Mother   . Cancer Father        skin  . Healthy Sister   . Healthy Brother   . Healthy Sister   . Healthy Sister   . Healthy Brother      History  Drug Use No     History  Alcohol Use  . Yes    Comment: socially     History  Smoking Status  . Former Smoker  . Packs/day: 0.50  . Years: 2.00  . Types: Cigarettes  . Quit date: 01/21/1998  Smokeless Tobacco  . Never Used     Outpatient Encounter Prescriptions  as of 07/30/2016  Medication Sig  . Ascorbic Acid (VITAMIN C PO) Take 1 tablet by mouth daily.  Marland Kitchen aspirin-acetaminophen-caffeine (EXCEDRIN MIGRAINE) 250-250-65 MG tablet Take 2 tablets by mouth every 6 (six) hours as needed for headache.  . Biotin w/ Vitamins C & E (HAIR/SKIN/NAILS PO) Take by mouth daily.  . cetirizine (ZYRTEC) 10 MG tablet Take 10 mg by mouth daily.  . cholecalciferol (VITAMIN D) 1000 units tablet Take 2,000 Units by mouth daily.  . fluticasone (FLONASE) 50 MCG/ACT nasal spray Place 1 spray into both nostrils daily as needed for allergies or rhinitis.  . Multiple Vitamins-Minerals (ECHINACEA ACZ PO) Take 1 tablet by mouth daily.  Marland Kitchen omeprazole (PRILOSEC) 20 MG capsule Take 1 capsule (20 mg total) by mouth daily.  . phentermine (ADIPEX-P) 37.5 MG tablet Take 1 tablet (37.5 mg total) by mouth daily before breakfast.  . RaNITidine HCl (ZANTAC PO) Take 1 tablet by mouth daily as needed (indigestion.).   No facility-administered encounter medications on file as of 07/30/2016.     Allergies: Latex and Neosporin [neomycin-bacitracin zn-polymyx]  Body mass index is 57.59 kg/m.  Height 5' 7.75" (1.721 m), weight (!) 376 lb (170.6 kg), last menstrual period 07/25/2016.     Review of Systems  Constitutional: Negative for  activity change, chills, diaphoresis, fatigue, fever and unexpected weight change.  Respiratory: Negative for cough, chest tightness, shortness of breath and wheezing.   Cardiovascular: Negative for chest pain, palpitations and leg swelling.  Gastrointestinal: Negative for abdominal distention, abdominal pain, blood in stool, constipation, diarrhea, nausea and vomiting.  Endocrine: Negative for cold intolerance, heat intolerance, polydipsia, polyphagia and polyuria.  Genitourinary: Negative for difficulty urinating, flank pain and hematuria.  Psychiatric/Behavioral: Negative for sleep disturbance.       Objective:   Physical Exam  Constitutional: She  appears well-developed and well-nourished. No distress.  Well groomed and smiled during entire OV  Cardiovascular: Normal rate, regular rhythm, normal heart sounds and intact distal pulses.   No murmur heard. Pulmonary/Chest: Effort normal and breath sounds normal. No respiratory distress. She has no rales. She exhibits no tenderness.  Skin: Skin is warm and dry. No rash noted. She is not diaphoretic. No erythema. No pallor.  Psychiatric: She has a normal mood and affect. Her behavior is normal. Judgment and thought content normal.  Nursing note and vitals reviewed.         Assessment & Plan:

## 2016-07-30 NOTE — Assessment & Plan Note (Addendum)
[place] Controlled Substance Registry verified, no contraindications to refill medical weight loss Rx. Phentermine 37.5mg  RF provided x two months. BP at goal 107/73. Continue heart healthy diet and continue to increase regular exercise (ie. YouTube videos). VERY PROUD OF YOU!!!

## 2016-07-30 NOTE — Patient Instructions (Signed)
Heart-Healthy Eating Plan Many factors influence your heart health, including eating and exercise habits. Heart (coronary) risk increases with abnormal blood fat (lipid) levels. Heart-healthy meal planning includes limiting unhealthy fats, increasing healthy fats, and making other small dietary changes. This includes maintaining a healthy body weight to help keep lipid levels within a normal range. What is my plan? Your health care provider recommends that you:  Get no more than _________% of the total calories in your daily diet from fat.  Limit your intake of saturated fat to less than _________% of your total calories each day.  Limit the amount of cholesterol in your diet to less than _________ mg per day.  What types of fat should I choose?  Choose healthy fats more often. Choose monounsaturated and polyunsaturated fats, such as olive oil and canola oil, flaxseeds, walnuts, almonds, and seeds.  Eat more omega-3 fats. Good choices include salmon, mackerel, sardines, tuna, flaxseed oil, and ground flaxseeds. Aim to eat fish at least two times each week.  Limit saturated fats. Saturated fats are primarily found in animal products, such as meats, butter, and cream. Plant sources of saturated fats include palm oil, palm kernel oil, and coconut oil.  Avoid foods with partially hydrogenated oils in them. These contain trans fats. Examples of foods that contain trans fats are stick margarine, some tub margarines, cookies, crackers, and other baked goods. What general guidelines do I need to follow?  Check food labels carefully to identify foods with trans fats or high amounts of saturated fat.  Fill one half of your plate with vegetables and green salads. Eat 4-5 servings of vegetables per day. A serving of vegetables equals 1 cup of raw leafy vegetables,  cup of raw or cooked cut-up vegetables, or  cup of vegetable juice.  Fill one fourth of your plate with whole grains. Look for the word  "whole" as the first word in the ingredient list.  Fill one fourth of your plate with lean protein foods.  Eat 4-5 servings of fruit per day. A serving of fruit equals one medium whole fruit,  cup of dried fruit,  cup of fresh, frozen, or canned fruit, or  cup of 100% fruit juice.  Eat more foods that contain soluble fiber. Examples of foods that contain this type of fiber are apples, broccoli, carrots, beans, peas, and barley. Aim to get 20-30 g of fiber per day.  Eat more home-cooked food and less restaurant, buffet, and fast food.  Limit or avoid alcohol.  Limit foods that are high in starch and sugar.  Avoid fried foods.  Cook foods by using methods other than frying. Baking, boiling, grilling, and broiling are all great options. Other fat-reducing suggestions include: ? Removing the skin from poultry. ? Removing all visible fats from meats. ? Skimming the fat off of stews, soups, and gravies before serving them. ? Steaming vegetables in water or broth.  Lose weight if you are overweight. Losing just 5-10% of your initial body weight can help your overall health and prevent diseases such as diabetes and heart disease.  Increase your consumption of nuts, legumes, and seeds to 4-5 servings per week. One serving of dried beans or legumes equals  cup after being cooked, one serving of nuts equals 1 ounces, and one serving of seeds equals  ounce or 1 tablespoon.  You may need to monitor your salt (sodium) intake, especially if you have high blood pressure. Talk with your health care provider or dietitian to get  more information about reducing sodium. What foods can I eat? Grains  Breads, including French, white, pita, wheat, raisin, rye, oatmeal, and Italian. Tortillas that are neither fried nor made with lard or trans fat. Low-fat rolls, including hotdog and hamburger buns and English muffins. Biscuits. Muffins. Waffles. Pancakes. Light popcorn. Whole-grain cereals. Flatbread.  Melba toast. Pretzels. Breadsticks. Rusks. Low-fat snacks and crackers, including oyster, saltine, matzo, graham, animal, and rye. Rice and pasta, including brown rice and those that are made with whole wheat. Vegetables All vegetables. Fruits All fruits, but limit coconut. Meats and Other Protein Sources Lean, well-trimmed beef, veal, pork, and lamb. Chicken and turkey without skin. All fish and shellfish. Wild duck, rabbit, pheasant, and venison. Egg whites or low-cholesterol egg substitutes. Dried beans, peas, lentils, and tofu.Seeds and most nuts. Dairy Low-fat or nonfat cheeses, including ricotta, string, and mozzarella. Skim or 1% milk that is liquid, powdered, or evaporated. Buttermilk that is made with low-fat milk. Nonfat or low-fat yogurt. Beverages Mineral water. Diet carbonated beverages. Sweets and Desserts Sherbets and fruit ices. Honey, jam, marmalade, jelly, and syrups. Meringues and gelatins. Pure sugar candy, such as hard candy, jelly beans, gumdrops, mints, marshmallows, and small amounts of dark chocolate. Angel food cake. Eat all sweets and desserts in moderation. Fats and Oils Nonhydrogenated (trans-free) margarines. Vegetable oils, including soybean, sesame, sunflower, olive, peanut, safflower, corn, canola, and cottonseed. Salad dressings or mayonnaise that are made with a vegetable oil. Limit added fats and oils that you use for cooking, baking, salads, and as spreads. Other Cocoa powder. Coffee and tea. All seasonings and condiments. The items listed above may not be a complete list of recommended foods or beverages. Contact your dietitian for more options. What foods are not recommended? Grains Breads that are made with saturated or trans fats, oils, or whole milk. Croissants. Butter rolls. Cheese breads. Sweet rolls. Donuts. Buttered popcorn. Chow mein noodles. High-fat crackers, such as cheese or butter crackers. Meats and Other Protein Sources Fatty meats, such  as hotdogs, short ribs, sausage, spareribs, bacon, ribeye roast or steak, and mutton. High-fat deli meats, such as salami and bologna. Caviar. Domestic duck and goose. Organ meats, such as kidney, liver, sweetbreads, brains, gizzard, chitterlings, and heart. Dairy Cream, sour cream, cream cheese, and creamed cottage cheese. Whole milk cheeses, including blue (bleu), Monterey Jack, Brie, [place], American, Havarti, Swiss, cheddar, Camembert, and [place]. Whole or 2% milk that is liquid, evaporated, or condensed. Whole buttermilk. Cream sauce or high-fat cheese sauce. Yogurt that is made from whole milk. Beverages Regular sodas and drinks with added sugar. Sweets and Desserts Frosting. Pudding. Cookies. Cakes other than angel food cake. Candy that has milk chocolate or white chocolate, hydrogenated fat, butter, coconut, or unknown ingredients. Buttered syrups. Full-fat ice cream or ice cream drinks. Fats and Oils Gravy that has suet, meat fat, or shortening. Cocoa butter, hydrogenated oils, palm oil, coconut oil, palm kernel oil. These can often be found in baked products, candy, fried foods, nondairy creamers, and whipped toppings. Solid fats and shortenings, including bacon fat, salt pork, lard, and butter. Nondairy cream substitutes, such as coffee creamers and sour cream substitutes. Salad dressings that are made of unknown oils, cheese, or sour cream. The items listed above may not be a complete list of foods and beverages to avoid. Contact your dietitian for more information. This information is not intended to replace advice given to you by your health care provider. Make sure you discuss any questions you have with your health care   provider. Document Released: 10/17/2007 Document Revised: 07/28/2015 Document Reviewed: 07/01/2013 Elsevier Interactive Patient Education  2017 [employer]   Exercising to [employer] Minor[employer]ose Weight Exercising can help you to lose weight. In order to lose weight through exercise,  you need to do vigorous-intensity exercise. You can tell that you are exercising with vigorous intensity if you are breathing very hard and fast and cannot hold a conversation while exercising. Moderate-intensity exercise helps to maintain your current weight. You can tell that you are exercising at a moderate level if you have a higher heart rate and faster breathing, but you are still able to hold a conversation. How often should I exercise? Choose an activity that you enjoy and set realistic goals. Your health care provider can help you to make an activity plan that works for you. Exercise regularly as directed by your health care provider. This may include:  Doing resistance training twice each week, such as: ? Push-ups. ? Sit-ups. ? Lifting weights. ? Using resistance bands.  Doing a given intensity of exercise for a given amount of time. Choose from these options: ? 150 minutes of moderate-intensity exercise every week. ? 75 minutes of vigorous-intensity exercise every week. ? A mix of moderate-intensity and vigorous-intensity exercise every week.  Children, pregnant women, people who are out of shape, people who are overweight, and older adults may need to consult a health care provider for individual recommendations. If you have any sort of medical condition, be sure to consult your health care provider before starting a new exercise program. What are some activities that can help me to lose weight?  Walking at a rate of at least 4.5 miles an hour.  Jogging or running at a rate of 5 miles per hour.  Biking at a rate of at least 10 miles per hour.  Lap swimming.  Roller-skating or in-line skating.  Cross-country skiing.  Vigorous competitive sports, such as football, basketball, and soccer.  Jumping rope.  Aerobic dancing. How can I be more active in my day-to-day activities?  Use the stairs instead of the elevator.  Take a walk during your lunch break.  If you drive,  park your car farther away from work or school.  If you take public transportation, get off one stop early and walk the rest of the way.  Make all of your phone calls while standing up and walking around.  Get up, stretch, and walk around every 30 minutes throughout the day. What guidelines should I follow while exercising?  Do not exercise so much that you hurt yourself, feel dizzy, or get very short of breath.  Consult your health care provider prior to starting a new exercise program.  Wear comfortable clothes and shoes with good support.  Drink plenty of water while you exercise to prevent dehydration or heat stroke. Body water is lost during exercise and must be replaced.  Work out until you breathe faster and your heart beats faster. This information is not intended to replace advice given to you by your health care provider. Make sure you discuss any questions you have with your health care provider. Document Released: 02/09/2010 Document Revised: 06/15/2015 Document Reviewed: 06/10/2013 Elsevier Interactive Patient Education  2018 [employer]  GREAT JOB! GREAT JOB! GREAT JOB! Continue healthy eating, regular movement (YouTube vidoes), and excellent water intake. Follow-up in two months, sooner if needed. GREAT JOB!!!!

## 2016-08-01 ENCOUNTER — Ambulatory Visit
Admission: RE | Admit: 2016-08-01 | Discharge: 2016-08-01 | Disposition: A | Discharge status: Home / Self Care | Payer: Managed Care, Other (non HMO) | Source: Ambulatory Visit | Attending: Obstetrics and Gynecology | Admitting: Obstetrics and Gynecology

## 2016-08-01 DIAGNOSIS — N6310 Unspecified lump in the right breast, unspecified quadrant: Secondary | ICD-10-CM

## 2016-08-07 ENCOUNTER — Emergency Department (HOSPITAL_COMMUNITY)
Admission: EM | Admit: 2016-08-07 | Discharge: 2016-08-07 | Disposition: A | Discharge status: Home / Self Care | Payer: Managed Care, Other (non HMO) | Attending: Emergency Medicine | Admitting: Emergency Medicine

## 2016-08-07 ENCOUNTER — Encounter (HOSPITAL_COMMUNITY): Payer: Self-pay | Admitting: Family Medicine

## 2016-08-07 ENCOUNTER — Emergency Department (HOSPITAL_COMMUNITY): Payer: Managed Care, Other (non HMO)

## 2016-08-07 DIAGNOSIS — Z91040 Latex allergy status: Secondary | ICD-10-CM | POA: Diagnosis not present

## 2016-08-07 DIAGNOSIS — Z87891 Personal history of nicotine dependence: Secondary | ICD-10-CM | POA: Insufficient documentation

## 2016-08-07 DIAGNOSIS — R1011 Right upper quadrant pain: Secondary | ICD-10-CM

## 2016-08-07 DIAGNOSIS — R110 Nausea: Secondary | ICD-10-CM | POA: Diagnosis not present

## 2016-08-07 DIAGNOSIS — Z79899 Other long term (current) drug therapy: Secondary | ICD-10-CM | POA: Insufficient documentation

## 2016-08-07 LAB — URINALYSIS, ROUTINE W REFLEX MICROSCOPIC
BILIRUBIN URINE: NEGATIVE
GLUCOSE, UA: NEGATIVE mg/dL
KETONES UR: NEGATIVE mg/dL
NITRITE: NEGATIVE
PROTEIN: NEGATIVE mg/dL
Specific Gravity, Urine: 1.016 (ref 1.005–1.030)
pH: 5 (ref 5.0–8.0)

## 2016-08-07 LAB — COMPREHENSIVE METABOLIC PANEL
ALK PHOS: 67 U/L (ref 38–126)
ALT: 29 U/L (ref 14–54)
ANION GAP: 8 (ref 5–15)
AST: 23 U/L (ref 15–41)
Albumin: 4 g/dL (ref 3.5–5.0)
BUN: 15 mg/dL (ref 6–20)
CALCIUM: 9 mg/dL (ref 8.9–10.3)
CO2: 25 mmol/L (ref 22–32)
Chloride: 107 mmol/L (ref 101–111)
Creatinine, Ser: 0.83 mg/dL (ref 0.44–1.00)
GFR calc non Af Amer: >60 mL/min (ref >60)
Glucose, Bld: 108 mg/dL — ABNORMAL HIGH (ref 65–99)
Potassium: 3.7 mmol/L (ref 3.5–5.1)
SODIUM: 140 mmol/L (ref 135–145)
Total Bilirubin: 0.4 mg/dL (ref 0.3–1.2)
Total Protein: 7.2 g/dL (ref 6.5–8.1)

## 2016-08-07 LAB — CBC
HCT: 39.9 % (ref 36.0–46.0)
HEMOGLOBIN: 13.6 g/dL (ref 12.0–15.0)
MCH: 29.8 pg (ref 26.0–34.0)
MCHC: 34.1 g/dL (ref 30.0–36.0)
MCV: 87.3 fL (ref 78.0–100.0)
Platelets: 253 10*3/uL (ref 150–400)
RBC: 4.57 MIL/uL (ref 3.87–5.11)
RDW: 13.3 % (ref 11.5–15.5)
WBC: 8.3 10*3/uL (ref 4.0–10.5)

## 2016-08-07 LAB — LIPASE, BLOOD: LIPASE: 23 U/L (ref 11–51)

## 2016-08-07 LAB — HCG, QUANTITATIVE, PREGNANCY: hCG, Beta Chain, Quant, S: <1 m[IU]/mL (ref <5)

## 2016-08-07 MED ORDER — OXYCODONE-ACETAMINOPHEN 5-325 MG PO TABS: 1.0000 | ORAL_TABLET | Freq: Four times a day (QID) | ORAL | 0 refills | Status: DC | PRN

## 2016-08-07 MED ORDER — ONDANSETRON 4 MG PO TBDP
4.0000 mg | ORAL_TABLET | Freq: Once | ORAL | Status: AC
  Administered 2016-08-07: 4 mg via ORAL
  Filled 2016-08-07: qty 1

## 2016-08-07 MED ORDER — OXYCODONE-ACETAMINOPHEN 5-325 MG PO TABS
2.0000 | ORAL_TABLET | Freq: Once | ORAL | Status: AC
  Administered 2016-08-07: 2 via ORAL
  Filled 2016-08-07: qty 2

## 2016-08-07 MED ORDER — ONDANSETRON 4 MG PO TBDP: 4.0000 mg | ORAL_TABLET | Freq: Three times a day (TID) | ORAL | 0 refills | Status: DC | PRN

## 2016-08-07 NOTE — Discharge Instructions (Signed)
Take medications as prescribed. Recommend a stool softener while needing any oxycodone. Also recommend Miralax 3 times daily for the next 2 days to clear your bowels and determine if this alters your right upper quadrant pain. Return to the ED if symptoms worsen - fever, severe pain, uncontrolled vomiting, bloody stool.

## 2016-08-07 NOTE — ED Notes (Signed)
Patient was alert, oriented and stable upon discharge. RN went over AVS and patient had no further questions.  

## 2016-08-07 NOTE — ED Notes (Signed)
Patient transported to CT 

## 2016-08-07 NOTE — ED Triage Notes (Signed)
Patient is complaining of right mid, lower back pain that radiates around to her abd. Denies any vomiting, fever, or any urinary symptoms. Patient reports pain started last night after lying down. Denies any recent injuries.

## 2016-08-07 NOTE — ED Provider Notes (Signed)
[facility]-EMERGENCY DEPT Provider Note   CSN: [id number] Arrival date & time: 08/07/16  0120     History   Chief Complaint Chief Complaint  Patient presents with  . Back Pain    HPI Sandy Christensen is a 31 y.o. female.  Patient with no significant medical history presents for evaluation of right sided mid-back pain that wraps around to the RUQ abdomen. Symptoms started last night after going to bed. No fever, vomiting. She denies having similar pain in the past. No history of kidney stones or gall bladder issues. No CP, cough or SOB.    The history is provided by the patient. No language interpreter was used.  Back Pain   Associated symptoms include abdominal pain. Pertinent negatives include no chest pain, no fever and no dysuria.    History reviewed. No pertinent past medical history.  Patient Active Problem List   Diagnosis Date Noted  . Breast pain 06/27/2016  . Acute carpal tunnel syndrome of right wrist 06/27/2016  . Morbid obesity (HCC) 04/24/2016  . Back pain 04/24/2016  . Epidermal cyst of ear 04/24/2016  . Health care maintenance 04/24/2016  . Closed nondisplaced fracture of middle phalanx of left ring finger with routine healing 08/02/2015    History reviewed. No pertinent surgical history.  OB History    Gravida Para Term Preterm AB Living   0 0 0 0 0 0   SAB TAB Ectopic Multiple Live Births   0 0 0 0 0       Home Medications    Prior to Admission medications   Medication Sig Start Date End Date Taking? Authorizing Provider  Ascorbic Acid (VITAMIN C PO) Take 1 tablet by mouth daily.   Yes [provider]  aspirin-acetaminophen-caffeine (EXCEDRIN MIGRAINE) [phone] MG tablet Take 2 tablets by mouth every 6 (six) hours as needed for headache.   Yes [provider]  Biotin w/ Vitamins C & E (HAIR/SKIN/NAILS PO) Take 1 tablet by mouth daily.    Yes [provider]  cetirizine (ZYRTEC) 10 MG tablet Take 10 mg by mouth daily.    Yes [provider]  cholecalciferol (VITAMIN D) 1000 units tablet Take 2,000 Units by mouth daily.   Yes [provider]  fluticasone (FLONASE) 50 MCG/ACT nasal spray Place 1 spray into both nostrils daily as needed for allergies or rhinitis.   Yes [provider]  Multiple Vitamins-Minerals (ECHINACEA ACZ PO) Take 1 tablet by mouth daily.   Yes [provider]  omeprazole (PRILOSEC) 20 MG capsule Take 1 capsule (20 mg total) by mouth daily. 06/27/16  Yes Danford, Jinny Blossom, NP  pediatric multivitamin-iron (POLY-VI-SOL WITH IRON) 15 MG chewable tablet Chew 1 tablet by mouth daily.   Yes [provider]  phentermine (ADIPEX-P) 37.5 MG tablet Take 1 tablet (37.5 mg total) by mouth daily before breakfast. 07/30/16  Yes Danford, Katy D, NP  RaNITidine HCl (ZANTAC PO) Take 1 tablet by mouth daily as needed (indigestion.).   Yes [provider]  phentermine (ADIPEX-P) 37.5 MG tablet Take 1 tablet (37.5 mg total) by mouth daily before breakfast. Patient not taking: Reported on 08/07/2016 08/30/16   Julaine Fusi, NP    Family History Family History  Problem Relation Age of Onset  . Hypertension Mother   . Cancer Father        skin  . Healthy Sister   . Healthy Brother   . Healthy Sister   . Healthy Sister   . Healthy  Brother     Social History Social History  Substance Use Topics  . Smoking status: Former Smoker    Packs/day: 0.50    Years: 2.00    Types: Cigarettes    Quit date: 01/21/1998  . Smokeless tobacco: Never Used  . Alcohol use Yes     Comment: Once a month     Allergies   Latex and Neosporin [neomycin-bacitracin zn-polymyx]   Review of Systems Review of Systems  Constitutional: Negative for chills and fever.  Respiratory: Negative.  Negative for cough and shortness of breath.   Cardiovascular: Negative.  Negative for chest pain.  Gastrointestinal: Positive for abdominal pain and nausea. Negative for vomiting.    Genitourinary: Negative for dysuria, vaginal bleeding and vaginal discharge.  Musculoskeletal: Positive for back pain.  Skin: Negative.   Neurological: Negative.      Physical Exam Updated Vital Signs BP 106/79 (BP Location: Right Arm)   Pulse (!) 58   Temp 97.6 F (36.4 C) (Oral)   Resp 16   Ht 5\' 10"  (1.778 m)   Wt (!) 165.6 kg (365 lb)   LMP 07/25/2016 Comment: negative HCG beta quant 08/07/16  SpO2 96%   BMI 52.37 kg/m   Physical Exam  Constitutional: She is oriented to person, place, and time. She appears well-developed and well-nourished.  HENT:  Head: Normocephalic.  Neck: Normal range of motion. Neck supple.  Cardiovascular: Normal rate and regular rhythm.   Pulmonary/Chest: Effort normal and breath sounds normal. She has no wheezes. She has no rales.  Abdominal: Soft. Bowel sounds are normal. There is tenderness (Tender RUQ extending to right flank and mid-back. ). There is no rebound and no guarding.  Musculoskeletal: Normal range of motion. She exhibits no edema.  Neurological: She is alert and oriented to person, place, and time.  Skin: Skin is warm and dry. No rash noted.  Psychiatric: She has a normal mood and affect.     ED Treatments / Results  Labs (all labs ordered are listed, but only abnormal results are displayed) Labs Reviewed  COMPREHENSIVE METABOLIC PANEL - Abnormal; Notable for the following:       Result Value   Glucose, Bld 108 (*)    All other components within normal limits  URINALYSIS, ROUTINE W REFLEX MICROSCOPIC - Abnormal; Notable for the following:    APPearance HAZY (*)    Hgb urine dipstick MODERATE (*)    Leukocytes, UA SMALL (*)    Bacteria, UA RARE (*)    Squamous Epithelial / LPF 0-5 (*)    All other components within normal limits  LIPASE, BLOOD  CBC  HCG, QUANTITATIVE, PREGNANCY   Results for orders placed or performed during the hospital encounter of 08/07/16  Lipase, blood  Result Value Ref Range   Lipase 23 11 -  51 U/L  Comprehensive metabolic panel  Result Value Ref Range   Sodium 140 135 - 145 mmol/L   Potassium 3.7 3.5 - 5.1 mmol/L   Chloride 107 101 - 111 mmol/L   CO2 25 22 - 32 mmol/L   Glucose, Bld 108 (H) 65 - 99 mg/dL   BUN 15 6 - 20 mg/dL   Creatinine, Ser 4.09 0.44 - 1.00 mg/dL   Calcium 9.0 8.9 - 81.1 mg/dL   Total Protein 7.2 6.5 - 8.1 g/dL   Albumin 4.0 3.5 - 5.0 g/dL   AST 23 15 - 41 U/L   ALT 29 14 - 54 U/L   Alkaline Phosphatase 67 38 -  126 U/L   Total Bilirubin 0.4 0.3 - 1.2 mg/dL   GFR calc non Af Amer >60 >60 mL/min   GFR calc Af Amer >60 >60 mL/min   Anion gap 8 5 - 15  CBC  Result Value Ref Range   WBC 8.3 4.0 - 10.5 K/uL   RBC 4.57 3.87 - 5.11 MIL/uL   Hemoglobin 13.6 12.0 - 15.0 g/dL   HCT 04.539.9 40.936.0 - 81.146.0 %   MCV 87.3 78.0 - 100.0 fL   MCH 29.8 26.0 - 34.0 pg   MCHC 34.1 30.0 - 36.0 g/dL   RDW 91.413.3 78.211.5 - 95.615.5 %   Platelets 253 150 - 400 K/uL  Urinalysis, Routine w reflex microscopic  Result Value Ref Range   Color, Urine YELLOW YELLOW   APPearance HAZY (A) CLEAR   Specific Gravity, Urine 1.016 1.005 - 1.030   pH 5.0 5.0 - 8.0   Glucose, UA NEGATIVE NEGATIVE mg/dL   Hgb urine dipstick MODERATE (A) NEGATIVE   Bilirubin Urine NEGATIVE NEGATIVE   Ketones, ur NEGATIVE NEGATIVE mg/dL   Protein, ur NEGATIVE NEGATIVE mg/dL   Nitrite NEGATIVE NEGATIVE   Leukocytes, UA SMALL (A) NEGATIVE   RBC / HPF 6-30 0 - 5 RBC/hpf   WBC, UA 0-5 0 - 5 WBC/hpf   Bacteria, UA RARE (A) NONE SEEN   Squamous Epithelial / LPF 0-5 (A) NONE SEEN   Mucous PRESENT   hCG, quantitative, pregnancy  Result Value Ref Range   hCG, Beta Chain, Quant, S <1 <5 mIU/mL     EKG  EKG Interpretation None       Radiology [place] Abdomen Complete  Result Date: 08/07/2016 CLINICAL DATA:  31 year old female with right upper quadrant/ right flank pain. EXAM: ABDOMEN ULTRASOUND COMPLETE COMPARISON:  None. FINDINGS: Evaluation is limited due to patient's body habitus. Gallbladder: No gallstones  or wall thickening visualized. No sonographic Murphy sign noted by sonographer. Common bile duct: Diameter: 4 mm Liver: There is increased liver echogenicity most consistent with fatty infiltration. Superimposed inflammation fibrosis is not excluded. Evaluation for possible liver lesion is very limited due to increased background echogenicity. IVC: No abnormality visualized. Pancreas: Visualized portion unremarkable. Spleen: Size and appearance within normal limits. Right Kidney: Length: 10.6 cm. Echogenicity within normal limits. No mass or hydronephrosis visualized. Left Kidney: Length: 11.4 cm. Echogenicity within normal limits. No mass or hydronephrosis visualized. Abdominal aorta: No aneurysm visualized. Mild ectasia of the proximal aorta measuring up to 2.4 cm. Other findings: None. IMPRESSION: 1. Fatty infiltration of the liver. 2. No gallstones. Electronically Signed   By: Elgie CollardArash  Radparvar M.D.   On: 08/07/2016 04:49   Ct Renal Stone Study  Result Date: 08/07/2016 CLINICAL DATA:  Right flank pain and microhematuria EXAM: CT ABDOMEN AND PELVIS WITHOUT CONTRAST TECHNIQUE: Multidetector CT imaging of the abdomen and pelvis was performed following the standard protocol without IV contrast. COMPARISON:  None. FINDINGS: Lower chest: No pulmonary nodules or pleural effusion. No visible pericardial effusion. Hepatobiliary: Normal noncontrast appearance of the liver. No visible biliary dilatation. Normal gallbladder. Pancreas: Normal noncontrast appearance of the pancreas. No peripancreatic fluid collection. Spleen: Normal. Adrenals/Urinary Tract: --Adrenal glands: Normal. --Right kidney/ureter: There is an 8 mm stone within the interpolar right kidney. No hydronephrosis. No ureteral obstruction. --Left kidney/ureter: No hydronephrosis or perinephric stranding. No nephrolithiasis. No obstructing ureteral stones. --Urinary bladder: Unremarkable. Stomach/Bowel: There is no hiatal hernia. The stomach and duodenum  are normal. There is no dilated small bowel or enteric inflammation. There is  no colonic abnormality. The appendix is normal. Vascular/Lymphatic: Minimal aortic atherosclerosis. No enlarged or abnormal density lymph nodes. Reproductive: Normal uterus and ovaries. Musculoskeletal. No focal osseous lesion. Normal visualized extraperitoneal and extrathoracic soft tissues. IMPRESSION: 1. No obstructive uropathy. 2. 8 mm interpolar right renal calculus without hydronephrosis. Electronically Signed   By: Deatra Robinson M.D.   On: 08/07/2016 05:28    Procedures Procedures (including critical care time)  Medications Ordered in ED Medications  oxyCODONE-acetaminophen (PERCOCET/ROXICET) 5-325 MG per tablet 2 tablet (2 tablets Oral Given 08/07/16 0325)  ondansetron (ZOFRAN-ODT) disintegrating tablet 4 mg (4 mg Oral Given 08/07/16 0543)     Initial Impression / Assessment and Plan / ED Course  I have reviewed the triage vital signs and the nursing notes.  Pertinent labs & imaging results that were available during my care of the patient were reviewed by me and considered in my medical decision making (see chart for details).     Patient presents with RUQ as well as right mid-back and flank pain as described. No fever. VSS.   US performed and is negative for gall stones. CT renal also done given distribution of pain and hematuria. No ureteral stones identified.   Pain is improved. VSS without change. No vomiting. She can be discharged home with PCP follow up for further outpatient management.   Final Clinical Impressions(s) / ED Diagnoses   Final diagnoses:  None   1. RUQ abdominal pain  New Prescriptions New Prescriptions   No medications on file     [place]ne [place] 08/17/16 2355    Rolland Porter, MD 08/26/16 [phone]

## 2016-08-09 ENCOUNTER — Ambulatory Visit (INDEPENDENT_AMBULATORY_CARE_PROVIDER_SITE_OTHER): Payer: Managed Care, Other (non HMO) | Admitting: Physical Medicine and Rehabilitation

## 2016-08-09 ENCOUNTER — Encounter (INDEPENDENT_AMBULATORY_CARE_PROVIDER_SITE_OTHER): Payer: Self-pay | Admitting: Physical Medicine and Rehabilitation

## 2016-08-09 DIAGNOSIS — R202 Paresthesia of skin: Secondary | ICD-10-CM

## 2016-08-09 NOTE — Progress Notes (Signed)
Right wrist tingling sensation with radiating numbness to fingers. Mostly fingers 2 - 4. occassional in thumb and pinky. Worse when driving, holding wrist in certain position, and when holding objects for long periods of time.  No injections.

## 2016-08-12 ENCOUNTER — Encounter: Payer: Self-pay | Admitting: Adult Health

## 2016-08-12 ENCOUNTER — Ambulatory Visit (INDEPENDENT_AMBULATORY_CARE_PROVIDER_SITE_OTHER): Payer: Managed Care, Other (non HMO) | Admitting: Obstetrics and Gynecology

## 2016-08-12 ENCOUNTER — Encounter: Payer: Self-pay | Admitting: Obstetrics and Gynecology

## 2016-08-12 ENCOUNTER — Ambulatory Visit (INDEPENDENT_AMBULATORY_CARE_PROVIDER_SITE_OTHER): Payer: Managed Care, Other (non HMO) | Admitting: Adult Health

## 2016-08-12 ENCOUNTER — Other Ambulatory Visit (HOSPITAL_COMMUNITY)
Admission: RE | Admit: 2016-08-12 | Discharge: 2016-08-12 | Disposition: A | Discharge status: Home / Self Care | Payer: Managed Care, Other (non HMO) | Source: Ambulatory Visit | Attending: Obstetrics and Gynecology | Admitting: Obstetrics and Gynecology

## 2016-08-12 VITALS — BP 112/68 | HR 72 | Resp 16 | Wt 371.0 lb

## 2016-08-12 DIAGNOSIS — Z23 Encounter for immunization: Secondary | ICD-10-CM

## 2016-08-12 DIAGNOSIS — Z01419 Encounter for gynecological examination (general) (routine) without abnormal findings: Secondary | ICD-10-CM

## 2016-08-12 DIAGNOSIS — M545 Low back pain: Secondary | ICD-10-CM

## 2016-08-12 DIAGNOSIS — Z0000 Encounter for general adult medical examination without abnormal findings: Secondary | ICD-10-CM | POA: Diagnosis not present

## 2016-08-12 NOTE — Patient Instructions (Addendum)

## 2016-08-12 NOTE — Assessment & Plan Note (Signed)
Examination completely normal today. Imaging completed last week at ED-negative. Recommend daily stretching and weight loss. Please keep f/u in sept for medical weigh loss.

## 2016-08-12 NOTE — Assessment & Plan Note (Signed)
Recommend  CPE this Fall/Winter

## 2016-08-12 NOTE — Progress Notes (Signed)
Sandy Christensen - 31 y.o. female MRN [id number]  Date of birth: 1985-06-19  Office Visit Note: Visit Date: 08/09/2016 PCP: Julaine Fusianford, Katy D, NP Referred by: Julaine Fusianford, Katy D, NP  Subjective: Chief Complaint  Patient presents with  . Right Wrist - Pain   HPI: [name] is a 31 year old female with chronic worsening tingling pain and numbness sensation radiating into the wrist and digits 2 through 4 on the right. Sometimes into the thumb. She endorses at times some symptoms into the fifth digit but most of the radial 3 digits. It's worse with driving is worse at night. She does get some symptoms and worsening numbness with holding her wrist in certain positions and with holding things for a long time. She has not had any injections or prior electrodiagnostic studies. She has been using wrist splints with some relief. She denies any frank radicular symptoms or frank trauma. She denies left-sided complaints.    ROS Otherwise per HPI.  Assessment & Plan: Visit Diagnoses:  1. Paresthesia of skin     Plan: No additional findings.  Impression: The above electrodiagnostic study is ABNORMAL and reveals evidence of a moderate median nerve entrapment at the wrist (carpal tunnel syndrome) affecting sensory and motor components. There is no significant electrodiagnostic evidence of any other focal nerve entrapment, brachial plexopathy or generalized peripheral neuropathy.   As you know, this particular electrodiagnostic study cannot rule out chemical radiculitis or sensory only radiculopathy.  Recommendations: 1.  Follow-up with referring physician. 2.  Continue current management of symptoms. 3.  Continue use of resting splint at night-time and as needed during the day. 4.  Suggest surgical evaluation.  Meds & Orders: No orders of the defined types were placed in this encounter.   Orders Placed This Encounter  Procedures  . NCV with EMG (electromyography)    Follow-up: Return in about  2 weeks (around 08/23/2016) for Dr. Roda ShuttersXu.   Procedures: No procedures performed  EMG & NCV Findings: Evaluation of the right median motor nerve showed prolonged distal onset latency (4.5 ms) and reduced amplitude (4.2 mV).  The right median (across palm) sensory nerve showed prolonged distal peak latency (Wrist, 4.9 ms) and prolonged distal peak latency (Palm, 2.2 ms).  All remaining nerves (as indicated in the following tables) were within normal limits.    All examined muscles (as indicated in the following table) showed no evidence of electrical instability.    Impression: The above electrodiagnostic study is ABNORMAL and reveals evidence of a moderate median nerve entrapment at the wrist (carpal tunnel syndrome) affecting sensory and motor components. There is no significant electrodiagnostic evidence of any other focal nerve entrapment, brachial plexopathy or generalized peripheral neuropathy.   As you know, this particular electrodiagnostic study cannot rule out chemical radiculitis or sensory only radiculopathy.  Recommendations: 1.  Follow-up with referring physician. 2.  Continue current management of symptoms. 3.  Continue use of resting splint at night-time and as needed during the day. 4.  Suggest surgical evaluation.    Nerve Conduction Studies Anti Sensory Summary Table   Stim Site NR Peak (ms) Norm Peak (ms) P-T Amp (V) Norm P-T Amp Site1 Site2 Delta-P (ms) Dist (cm) Vel (m/s) Norm Vel (m/s)  Right Median Acr Palm Anti Sensory (2nd Digit)  31.6C  Wrist    *4.9 <3.6 32.1 >10 Wrist Palm 2.7 0.0    Palm    *2.2 <2.0 39.0         Right Radial Anti Sensory (Base 1st  Digit)  32.2C  Wrist    1.8 <3.1 23.9  Wrist Base 1st Digit 1.8 0.0    Right Ulnar Anti Sensory (5th Digit)  31.9C  Wrist    3.2 <3.7 40.5 >15.0 Wrist 5th Digit 3.2 14.0 44 >38   Motor Summary Table   Stim Site NR Onset (ms) Norm Onset (ms) O-P Amp (mV) Norm O-P Amp Site1 Site2 Delta-0 (ms) Dist (cm) Vel  (m/s) Norm Vel (m/s)  Right Median Motor (Abd Poll Brev)  32C  Wrist    *4.5 <4.2 *4.2 >5 Elbow Wrist 4.3 23.2 54 >50  Elbow    8.8  3.9         Right Ulnar Motor (Abd Dig Min)  31.7C  Wrist    2.8 <4.2 6.5 >3 B Elbow Wrist 3.4 23.0 68 >53  B Elbow    6.2  6.7  A Elbow B Elbow 1.1 10.0 91 >53  A Elbow    7.3  7.2          EMG   Side Muscle Nerve Root Ins Act Fibs Psw Amp Dur Poly Recrt Int Dennie Bible Comment  Right Abd Poll Brev Median C8-T1 Nml Nml Nml Nml Nml 0 Nml Nml   Right 1stDorInt Ulnar C8-T1 Nml Nml Nml Nml Nml 0 Nml Nml   Right PronatorTeres Median C6-7 Nml Nml Nml Nml Nml 0 Nml Nml     Nerve Conduction Studies Anti Sensory Left/Right Comparison   Stim Site L Lat (ms) R Lat (ms) L-R Lat (ms) L Amp (V) R Amp (V) L-R Amp (%) Site1 Site2 L Vel (m/s) R Vel (m/s) L-R Vel (m/s)  Median Acr Palm Anti Sensory (2nd Digit)  31.6C  Wrist  *4.9   32.1  Wrist Palm     Palm  *2.2   39.0        Radial Anti Sensory (Base 1st Digit)  32.2C  Wrist  1.8   23.9  Wrist Base 1st Digit     Ulnar Anti Sensory (5th Digit)  31.9C  Wrist  3.2   40.5  Wrist 5th Digit  44    Motor Left/Right Comparison   Stim Site L Lat (ms) R Lat (ms) L-R Lat (ms) L Amp (mV) R Amp (mV) L-R Amp (%) Site1 Site2 L Vel (m/s) R Vel (m/s) L-R Vel (m/s)  Median Motor (Abd Poll Brev)  32C  Wrist  *4.5   *4.2  Elbow Wrist  54   Elbow  8.8   3.9        Ulnar Motor (Abd Dig Min)  31.7C  Wrist  2.8   6.5  B Elbow Wrist  68   B Elbow  6.2   6.7  A Elbow B Elbow  91   A Elbow  7.3   7.2              Clinical History: No specialty comments available.  She reports that she quit smoking about 18 years ago. Her smoking use included Cigarettes. She has a 1.00 pack-year smoking history. She has never used smokeless tobacco.   Recent Labs  05/31/16 0834  HGBA1C 5.7*    Objective:  VS:  HT:    WT:   BMI:     BP:   HR: bpm  TEMP: ( )  RESP:  Physical Exam  Musculoskeletal:  Inspection reveals no atrophy of  the bilateral APB or FDI or hand intrinsics. There is no swelling, color changes, allodynia or dystrophic  changes. There is 5 out of 5 strength in the bilateral wrist extension, finger abduction and long finger flexion. There is intact sensation to light touch in all dermatomal and peripheral nerve distributions. There is a negative Hoffmann's test bilaterally.    Ortho Exam Imaging: No results found.  Past Medical/Family/Surgical/Social History: Medications & Allergies reviewed per EMR Patient Active Problem List   Diagnosis Date Noted  . Breast pain 06/27/2016  . Acute carpal tunnel syndrome of right wrist 06/27/2016  . Morbid obesity (HCC) 04/24/2016  . Back pain 04/24/2016  . Epidermal cyst of ear 04/24/2016  . Health care maintenance 04/24/2016  . Closed nondisplaced fracture of middle phalanx of left ring finger with routine healing 08/02/2015   No past medical history on file. Family History  Problem Relation Age of Onset  . Hypertension Mother   . Cancer Father        skin  . Healthy Sister   . Healthy Brother   . Healthy Sister   . Healthy Sister   . Healthy Brother    No past surgical history on file. Social History   Occupational History  . Not on file.   Social History Main Topics  . Smoking status: Former Smoker    Packs/day: 0.50    Years: 2.00    Types: Cigarettes    Quit date: 01/21/1998  . Smokeless tobacco: Never Used  . Alcohol use Yes     Comment: Once a month  . Drug use: No  . Sexual activity: Yes    Birth control/ protection: None

## 2016-08-12 NOTE — Patient Instructions (Signed)

## 2016-08-12 NOTE — Progress Notes (Signed)
   Subjective:    Patient ID: Sandy Christensen, female    DOB: 1985/10/15, 31 y.o.   MRN: [id number]  HPI:  [name] presents with R lumbar pain that was initially intense and she sought care at local ED last week. She denies acute trauma/injury prior to onset of pain. Over the weekend the pain/nausea resolved.  Last dose of narcotic pain medication was last night.  She denies bowel/bladder dysfunction. She denies blood in urine/stool. She denies numbness/tingling in lower extremities.  [facility] is at [employer] and requests FMLA paperwork to be completed.  [name]'s wife stayed home to care for her and she needs documentation to provide to her employer. Of Note:  Reviewed CT and abdominal US results, no further imaging or specialist referral recommended.    Review of Systems  Constitutional: Positive for activity change and fatigue. Negative for appetite change, chills, diaphoresis, fever and unexpected weight change.  Respiratory: Negative for cough, chest tightness, shortness of breath, wheezing and stridor.   Cardiovascular: Negative for chest pain, palpitations and leg swelling.  Gastrointestinal: Negative for abdominal distention, abdominal pain, blood in stool, constipation, diarrhea, nausea and vomiting.  Endocrine: Negative for cold intolerance, heat intolerance, polydipsia and polyphagia.  Genitourinary: Negative for difficulty urinating, flank pain and hematuria.  Musculoskeletal: Positive for arthralgias, back pain and myalgias. Negative for gait problem, joint swelling, neck pain and neck stiffness.  Skin: Negative for color change, pallor, rash and wound.  Allergic/Immunologic: Negative for immunocompromised state.  Neurological: Negative for dizziness, tremors, weakness and headaches.  Hematological: Does not bruise/bleed easily.  Psychiatric/Behavioral: Positive for sleep disturbance. The patient is not nervous/anxious and is not hyperactive.        Objective:   Physical  Exam  Constitutional: She is oriented to person, place, and time. She appears well-developed and well-nourished. No distress.  Musculoskeletal: Normal range of motion. She exhibits no edema, tenderness or deformity.  Neurological: She is alert and oriented to person, place, and time. She displays normal reflexes. She exhibits normal muscle tone. Coordination normal.  Skin: Skin is warm and dry. No rash noted. She is not diaphoretic. No erythema. No pallor.  Psychiatric: She has a normal mood and affect. Her behavior is normal. Judgment and thought content normal.  Nursing note and vitals reviewed.         Assessment & Plan:   1. Acute bilateral low back pain, with sciatica presence unspecified   2. Health care maintenance     Back pain  Examination completely normal today. Imaging completed last week at ED-negative. Recommend daily stretching and weight loss. Please keep f/u in sept for medical weigh loss.   Health care maintenance Recommend  CPE this Fall/Winter    FOLLOW-UP:  Return if symptoms worsen or fail to improve.

## 2016-08-12 NOTE — Procedures (Signed)
EMG & NCV Findings: Evaluation of the right median motor nerve showed prolonged distal onset latency (4.5 ms) and reduced amplitude (4.2 mV).  The right median (across palm) sensory nerve showed prolonged distal peak latency (Wrist, 4.9 ms) and prolonged distal peak latency (Palm, 2.2 ms).  All remaining nerves (as indicated in the following tables) were within normal limits.    All examined muscles (as indicated in the following table) showed no evidence of electrical instability.    Impression: The above electrodiagnostic study is ABNORMAL and reveals evidence of a moderate median nerve entrapment at the wrist (carpal tunnel syndrome) affecting sensory and motor components. There is no significant electrodiagnostic evidence of any other focal nerve entrapment, brachial plexopathy or generalized peripheral neuropathy.   As you know, this particular electrodiagnostic study cannot rule out chemical radiculitis or sensory only radiculopathy.  Recommendations: 1.  Follow-up with referring physician. 2.  Continue current management of symptoms. 3.  Continue use of resting splint at night-time and as needed during the day. 4.  Suggest surgical evaluation.    Nerve Conduction Studies Anti Sensory Summary Table   Stim Site NR Peak (ms) Norm Peak (ms) P-T Amp (V) Norm P-T Amp Site1 Site2 Delta-P (ms) Dist (cm) Vel (m/s) Norm Vel (m/s)  Right Median Acr Palm Anti Sensory (2nd Digit)  31.6C  Wrist    *4.9 <3.6 32.1 >10 Wrist Palm 2.7 0.0    Palm    *2.2 <2.0 39.0         Right Radial Anti Sensory (Base 1st Digit)  32.2C  Wrist    1.8 <3.1 23.9  Wrist Base 1st Digit 1.8 0.0    Right Ulnar Anti Sensory (5th Digit)  31.9C  Wrist    3.2 <3.7 40.5 >15.0 Wrist 5th Digit 3.2 14.0 44 >38   Motor Summary Table   Stim Site NR Onset (ms) Norm Onset (ms) O-P Amp (mV) Norm O-P Amp Site1 Site2 Delta-0 (ms) Dist (cm) Vel (m/s) Norm Vel (m/s)  Right Median Motor (Abd Poll Brev)  32C  Wrist    *4.5 <4.2  *4.2 >5 Elbow Wrist 4.3 23.2 54 >50  Elbow    8.8  3.9         Right Ulnar Motor (Abd Dig Min)  31.7C  Wrist    2.8 <4.2 6.5 >3 B Elbow Wrist 3.4 23.0 68 >53  B Elbow    6.2  6.7  A Elbow B Elbow 1.1 10.0 91 >53  A Elbow    7.3  7.2          EMG   Side Muscle Nerve Root Ins Act Fibs Psw Amp Dur Poly Recrt Int Dennie BiblePat Comment  Right Abd Poll Brev Median C8-T1 Nml Nml Nml Nml Nml 0 Nml Nml   Right 1stDorInt Ulnar C8-T1 Nml Nml Nml Nml Nml 0 Nml Nml   Right PronatorTeres Median C6-7 Nml Nml Nml Nml Nml 0 Nml Nml     Nerve Conduction Studies Anti Sensory Left/Right Comparison   Stim Site L Lat (ms) R Lat (ms) L-R Lat (ms) L Amp (V) R Amp (V) L-R Amp (%) Site1 Site2 L Vel (m/s) R Vel (m/s) L-R Vel (m/s)  Median Acr Palm Anti Sensory (2nd Digit)  31.6C  Wrist  *4.9   32.1  Wrist Palm     Palm  *2.2   39.0        Radial Anti Sensory (Base 1st Digit)  32.2C  Wrist  1.8   23.9  Wrist Base 1st Digit     Ulnar Anti Sensory (5th Digit)  31.9C  Wrist  3.2   40.5  Wrist 5th Digit  44    Motor Left/Right Comparison   Stim Site L Lat (ms) R Lat (ms) L-R Lat (ms) L Amp (mV) R Amp (mV) L-R Amp (%) Site1 Site2 L Vel (m/s) R Vel (m/s) L-R Vel (m/s)  Median Motor (Abd Poll Brev)  32C  Wrist  *4.5   *4.2  Elbow Wrist  54   Elbow  8.8   3.9        Ulnar Motor (Abd Dig Min)  31.7C  Wrist  2.8   6.5  B Elbow Wrist  68   B Elbow  6.2   6.7  A Elbow B Elbow  91   A Elbow  7.3   7.2
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
## 2016-08-14 LAB — CYTOLOGY - PAP
DIAGNOSIS: NEGATIVE
HPV: NOT DETECTED

## 2016-08-15 ENCOUNTER — Telehealth: Payer: Self-pay | Admitting: Adult Health

## 2016-08-15 NOTE — Telephone Encounter (Signed)
Pt called states Ins needs her Labs ,request we send most recent labs to her email address([email or web address] --glh

## 2016-08-15 NOTE — Telephone Encounter (Signed)
Patient is going to sign up for mychart to view her lab results.

## 2016-08-22 ENCOUNTER — Ambulatory Visit (INDEPENDENT_AMBULATORY_CARE_PROVIDER_SITE_OTHER): Payer: Managed Care, Other (non HMO) | Admitting: Orthopaedic Surgery

## 2016-08-22 ENCOUNTER — Encounter (INDEPENDENT_AMBULATORY_CARE_PROVIDER_SITE_OTHER): Payer: Self-pay | Admitting: Orthopaedic Surgery

## 2016-08-22 DIAGNOSIS — G5601 Carpal tunnel syndrome, right upper limb: Secondary | ICD-10-CM | POA: Diagnosis not present

## 2016-08-22 MED ORDER — BUPIVACAINE HCL 0.5 % IJ SOLN
0.5000 mL | INTRAMUSCULAR | Status: AC | PRN
  Administered 2016-08-22: .5 mL

## 2016-08-22 MED ORDER — LIDOCAINE HCL 1 % IJ SOLN
0.5000 mL | INTRAMUSCULAR | Status: AC | PRN
  Administered 2016-08-22: .5 mL

## 2016-08-22 MED ORDER — METHYLPREDNISOLONE ACETATE 40 MG/ML IJ SUSP
20.0000 mg | INTRAMUSCULAR | Status: AC | PRN
  Administered 2016-08-22: 20 mg

## 2016-08-22 NOTE — Progress Notes (Signed)
   Office Visit Note   Patient: Sandy Christensen           Date of Birth: [birth year]           MRN: [id number] Visit Date: 08/22/2016              Requested by: Julaine Fusianford, Katy D, NP [address] [place], [place] [id number] PCP: Julaine Fusianford, Katy D, NP   Assessment & Plan: Visit Diagnoses:  1. Right carpal tunnel syndrome     Plan: Nerve conduction studies show moderate carpal tunnel syndrome Patient elected to try injection first to see how much relief she gets. This was performed today. Patient had no adverse effects. Follow-up as needed.  Follow-Up Instructions: Return if symptoms worsen or fail to improve.   Orders:  No orders of the defined types were placed in this encounter.  No orders of the defined types were placed in this encounter.     Procedures: Hand/UE Inj Date/Time: 08/22/2016 8:37 AM Performed by: Tarry KosXU, Myiesha Edgar M Authorized by: Tarry KosXU, Amyri Frenz M   Consent Given by:  Patient Timeout: prior to procedure the correct patient, procedure, and site was verified   Indications:  Pain Condition: carpal tunnel   Site:  R carpal tunnel Prep: patient was prepped and draped in usual sterile fashion   Needle Size:  25 G Approach:  Volar Medications:  0.5 mL lidocaine 1 %; 0.5 mL bupivacaine 0.5 %; 20 mg methylPREDNISolone acetate 40 MG/ML     Clinical Data: No additional findings.   Subjective: Chief Complaint  Patient presents with  . Right Wrist - Pain    Patient follows up today for her nerve conduction studies. Nighttime splinting has helped.    Review of Systems   Objective: Vital Signs: LMP 07/25/2016 Comment: negative HCG beta quant 08/07/16  Physical Exam  Ortho Exam Exam is stable. Specialty Comments:  No specialty comments available.  Imaging: No results found.   PMFS History: Patient Active Problem List   Diagnosis Date Noted  . Breast pain 06/27/2016  . Acute carpal tunnel syndrome of right wrist 06/27/2016  . Morbid obesity (HCC)  04/24/2016  . Back pain 04/24/2016  . Epidermal cyst of ear 04/24/2016  . Health care maintenance 04/24/2016  . Closed nondisplaced fracture of middle phalanx of left ring finger with routine healing 08/02/2015   No past medical history on file.  Family History  Problem Relation Age of Onset  . Hypertension Mother   . Cancer Father        skin  . Healthy Sister   . Healthy Brother   . Healthy Sister   . Healthy Sister   . Healthy Brother     No past surgical history on file. Social History   Occupational History  . Not on file.   Social History Main Topics  . Smoking status: Former Smoker    Packs/day: 0.50    Years: 2.00    Types: Cigarettes    Quit date: 01/21/1998  . Smokeless tobacco: Never Used  . Alcohol use Yes     Comment: Once a month  . Drug use: No  . Sexual activity: Yes    Birth control/ protection: None

## 2016-09-02 ENCOUNTER — Other Ambulatory Visit: Payer: Self-pay

## 2016-09-04 ENCOUNTER — Other Ambulatory Visit: Payer: Self-pay

## 2016-09-04 MED ORDER — OMEPRAZOLE 20 MG PO CPDR: 20.0000 mg | DELAYED_RELEASE_CAPSULE | Freq: Every day | ORAL | 3 refills | Status: DC

## 2016-09-30 ENCOUNTER — Encounter: Payer: Self-pay | Admitting: Adult Health

## 2016-09-30 ENCOUNTER — Ambulatory Visit (INDEPENDENT_AMBULATORY_CARE_PROVIDER_SITE_OTHER): Payer: Managed Care, Other (non HMO) | Admitting: Adult Health

## 2016-09-30 MED ORDER — PHENTERMINE HCL 37.5 MG PO TABS: 37.5000 mg | ORAL_TABLET | Freq: Every day | ORAL | 0 refills | Status: DC

## 2016-09-30 NOTE — Assessment & Plan Note (Addendum)
BP at goal 114/77 Wt 353 BMI 54 She has lost >55 lbs since starting phentermine therapy in June 2018-GREAT JOB! Continue YouTube/Pinterest workout videos and low card diet, Another month of Phentermine provided-this is the 4th month of therapy. [place] Controlled Substance Database verified-two short rx's for narcotics noted-r/t lumbar back pain.  She denies acute/recurrent back pain in months.   F/u in 4 weeks, will consider cycling off medication for few months.

## 2016-09-30 NOTE — Patient Instructions (Signed)
Exercising to Lose Weight Exercising can help you to lose weight. In order to lose weight through exercise, you need to do vigorous-intensity exercise. You can tell that you are exercising with vigorous intensity if you are breathing very hard and fast and cannot hold a conversation while exercising. Moderate-intensity exercise helps to maintain your current weight. You can tell that you are exercising at a moderate level if you have a higher heart rate and faster breathing, but you are still able to hold a conversation. How often should I exercise? Choose an activity that you enjoy and set realistic goals. Your health care provider can help you to make an activity plan that works for you. Exercise regularly as directed by your health care provider. This may include:  Doing resistance training twice each week, such as: ? Push-ups. ? Sit-ups. ? Lifting weights. ? Using resistance bands.  Doing a given intensity of exercise for a given amount of time. Choose from these options: ? 150 minutes of moderate-intensity exercise every week. ? 75 minutes of vigorous-intensity exercise every week. ? A mix of moderate-intensity and vigorous-intensity exercise every week.  Children, pregnant women, people who are out of shape, people who are overweight, and older adults may need to consult a health care provider for individual recommendations. If you have any sort of medical condition, be sure to consult your health care provider before starting a new exercise program. What are some activities that can help me to lose weight?  Walking at a rate of at least 4.5 miles an hour.  Jogging or running at a rate of 5 miles per hour.  Biking at a rate of at least 10 miles per hour.  Lap swimming.  Roller-skating or in-line skating.  Cross-country skiing.  Vigorous competitive sports, such as football, basketball, and soccer.  Jumping rope.  Aerobic dancing. How can I be more active in my day-to-day  activities?  Use the stairs instead of the elevator.  Take a walk during your lunch break.  If you drive, park your car farther away from work or school.  If you take public transportation, get off one stop early and walk the rest of the way.  Make all of your phone calls while standing up and walking around.  Get up, stretch, and walk around every 30 minutes throughout the day. What guidelines should I follow while exercising?  Do not exercise so much that you hurt yourself, feel dizzy, or get very short of breath.  Consult your health care provider prior to starting a new exercise program.  Wear comfortable clothes and shoes with good support.  Drink plenty of water while you exercise to prevent dehydration or heat stroke. Body water is lost during exercise and must be replaced.  Work out until you breathe faster and your heart beats faster. This information is not intended to replace advice given to you by your health care provider. Make sure you discuss any questions you have with your health care provider. Document Released: 02/09/2010 Document Revised: 06/15/2015 Document Reviewed: 06/10/2013 Elsevier Interactive Patient Education  2018 [employer]  GREAT JOB! Keep up the healthy eating, regular exercise, and excellent water intake. Please return in 4 weeks for regular follow-up. GREAT JOB!!!!

## 2016-09-30 NOTE — Progress Notes (Signed)
Subjective:    Patient ID: Sandy Christensen, female    DOB: 26-Mar-1985, 31 y.o.   MRN: [id number]  HPI:  Ms. Heuerman is here for f/u: medical wt loss. She has been taking phentermine 37.5mg  daily and following low carb diet.  She has also been drinking >gallon water/day and exercising several times daily with short YouTube/Pinterest workout videos. She denies tobacco/EOTH use.   Patient Care Team    Relationship Specialty Notifications Start End  Julaine Fusi, NP PCP - General Family Medicine  04/24/16     Patient Active Problem List   Diagnosis Date Noted  . Breast pain 06/27/2016  . Acute carpal tunnel syndrome of right wrist 06/27/2016  . Morbid obesity (HCC) 04/24/2016  . Back pain 04/24/2016  . Epidermal cyst of ear 04/24/2016  . Health care maintenance 04/24/2016  . Closed nondisplaced fracture of middle phalanx of left ring finger with routine healing 08/02/2015     History reviewed. No pertinent past medical history.   History reviewed. No pertinent surgical history.   Family History  Problem Relation Age of Onset  . Hypertension Mother   . Cancer Father        skin  . Healthy Sister   . Healthy Brother   . Healthy Sister   . Healthy Sister   . Healthy Brother      History  Drug Use No     History  Alcohol Use  . Yes    Comment: Once a month     History  Smoking Status  . Former Smoker  . Packs/day: 0.50  . Years: 2.00  . Types: Cigarettes  . Quit date: 01/21/1998  Smokeless Tobacco  . Never Used     Outpatient Encounter Prescriptions as of 09/30/2016  Medication Sig  . Ascorbic Acid (VITAMIN C PO) Take 1 tablet by mouth daily.  Marland Kitchen aspirin-acetaminophen-caffeine (EXCEDRIN MIGRAINE) 250-250-65 MG tablet Take 2 tablets by mouth every 6 (six) hours as needed for headache.  . Biotin w/ Vitamins C & E (HAIR/SKIN/NAILS PO) Take 1 tablet by mouth daily.   . cetirizine (ZYRTEC) 10 MG tablet Take 10 mg by mouth daily.  . cholecalciferol  (VITAMIN D) 1000 units tablet Take 2,000 Units by mouth daily.  . fluticasone (FLONASE) 50 MCG/ACT nasal spray Place 1 spray into both nostrils daily as needed for allergies or rhinitis.  . Multiple Vitamins-Minerals (ECHINACEA ACZ PO) Take 1 tablet by mouth daily.  Marland Kitchen omeprazole (PRILOSEC) 20 MG capsule Take 1 capsule (20 mg total) by mouth daily.  . phentermine (ADIPEX-P) 37.5 MG tablet Take 1 tablet (37.5 mg total) by mouth daily before breakfast.  . RaNITidine HCl (ZANTAC PO) Take 1 tablet by mouth daily as needed (indigestion.).  . [DISCONTINUED] phentermine (ADIPEX-P) 37.5 MG tablet Take 1 tablet (37.5 mg total) by mouth daily before breakfast.   No facility-administered encounter medications on file as of 09/30/2016.     Allergies: Latex and Neosporin [neomycin-bacitracin zn-polymyx]  Body mass index is 54.07 kg/m.  Blood pressure 114/77, pulse 80, height 5' 7.75" (1.721 m), weight (!) 353 lb (160.1 kg), last menstrual period 09/26/2016.     Review of Systems  Constitutional: Negative for activity change, appetite change, chills, diaphoresis, fatigue, fever and unexpected weight change.  Respiratory: Negative for cough, chest tightness, shortness of breath, wheezing and stridor.   Cardiovascular: Negative for chest pain, palpitations and leg swelling.  Gastrointestinal: Negative for abdominal distention, abdominal pain, blood in stool, constipation, diarrhea, nausea, rectal pain  and vomiting.  Endocrine: Negative for cold intolerance, heat intolerance, polydipsia, polyphagia and polyuria.  Genitourinary: Negative for difficulty urinating and flank pain.  Neurological: Negative for headaches.  Hematological: Does not bruise/bleed easily.  Psychiatric/Behavioral: Negative for sleep disturbance.       Objective:   Physical Exam  Constitutional: She appears well-developed and well-nourished. No distress.  HENT:  Head: Normocephalic and atraumatic.  Right Ear: External ear  normal.  Left Ear: External ear normal.  Cardiovascular: Normal rate, regular rhythm, normal heart sounds and intact distal pulses.   No murmur heard. Pulmonary/Chest: Effort normal and breath sounds normal. No respiratory distress. She has no wheezes. She has no rales. She exhibits no tenderness.  Neurological: She is alert.  Skin: Skin is warm and dry. No rash noted. She is not diaphoretic. No erythema. No pallor.  Psychiatric: She has a normal mood and affect. Her behavior is normal. Judgment and thought content normal.  Nursing note and vitals reviewed.      Assessment & Plan:   1. Morbid obesity (HCC)     Morbid obesity (HCC) BP at goal 114/77 Wt 353 BMI 54 She has lost >55 lbs since starting phentermine therapy in June 2018-GREAT JOB! Continue YouTube/Pinterest workout videos and low card diet, Another month of Phentermine provided-this is the 4th month of therapy. [place] Controlled Substance Database verified-two short rx's for narcotics noted-r/t lumbar back pain.  She denies acute/recurrent back pain in months.   F/u in 4 weeks, will consider cycling off medication for few months.    FOLLOW-UP:  Return in about 4 weeks (around 10/28/2016) for Regular Follow Up, Medical Weight Loss.

## 2016-10-29 ENCOUNTER — Encounter: Payer: Self-pay | Admitting: Adult Health

## 2016-10-29 ENCOUNTER — Ambulatory Visit (INDEPENDENT_AMBULATORY_CARE_PROVIDER_SITE_OTHER): Payer: Managed Care, Other (non HMO) | Admitting: Adult Health

## 2016-10-29 VITALS — BP 109/72 | HR 86 | Ht 67.75 in | Wt 347.8 lb

## 2016-10-29 DIAGNOSIS — Z23 Encounter for immunization: Secondary | ICD-10-CM

## 2016-10-29 MED ORDER — PHENTERMINE HCL 37.5 MG PO TABS: 37.5000 mg | ORAL_TABLET | Freq: Every day | ORAL | 0 refills | Status: DC

## 2016-10-29 NOTE — Progress Notes (Signed)
Subjective:    Patient ID: Sandy Christensen, female    DOB: 1985/02/16, 31 y.o.   MRN: [id number]  HPI:  09/30/2016 OV: [name] is here for f/u: medical wt loss. She has been taking phentermine 37.5mg  daily and following low carb diet.  She has also been drinking >gallon water/day and exercising several times daily with short YouTube/Pinterest workout videos. She denies tobacco/EOTH use.   Today's OV 10/29/2016: [name] is here for f/u on medical wt loss.  She has been on phentermine 37.5mg  since 06/27/16 (total of 4 months) and has lost total of  Goal wt 200 lbs.  She has continued to drink > gallon/water a day and utilizing YouTube/Pinterest workout videos- 3-30min videos BID.  Her diet mostly consists of lean protein and vegetables.  She denies palpitations/insomnia.  She continues to avoid tobacco and rarely consumes ETOH.  Patient Care Team    Relationship Specialty Notifications Start End  Julaine Fusi, NP PCP - General Family Medicine  04/24/16     Patient Active Problem List   Diagnosis Date Noted  . Breast pain 06/27/2016  . Acute carpal tunnel syndrome of right wrist 06/27/2016  . Morbid obesity (HCC) 04/24/2016  . Back pain 04/24/2016  . Epidermal cyst of ear 04/24/2016  . Health care maintenance 04/24/2016  . Closed nondisplaced fracture of middle phalanx of left ring finger with routine healing 08/02/2015     History reviewed. No pertinent past medical history.   History reviewed. No pertinent surgical history.   Family History  Problem Relation Age of Onset  . Hypertension Mother   . Cancer Father        skin  . Healthy Sister   . Healthy Brother   . Healthy Sister   . Healthy Sister   . Healthy Brother      History  Drug Use No     History  Alcohol Use  . Yes    Comment: Once a month     History  Smoking Status  . Former Smoker  . Packs/day: 0.50  . Years: 2.00  . Types: Cigarettes  . Quit date: 01/21/1998  Smokeless Tobacco   . Never Used     Outpatient Encounter Prescriptions as of 10/29/2016  Medication Sig  . Ascorbic Acid (VITAMIN C PO) Take 1 tablet by mouth daily.  Marland Kitchen aspirin-acetaminophen-caffeine (EXCEDRIN MIGRAINE) 250-250-65 MG tablet Take 2 tablets by mouth every 6 (six) hours as needed for headache.  . Biotin w/ Vitamins C & E (HAIR/SKIN/NAILS PO) Take 1 tablet by mouth daily.   . cetirizine (ZYRTEC) 10 MG tablet Take 10 mg by mouth daily.  . cholecalciferol (VITAMIN D) 1000 units tablet Take 2,000 Units by mouth daily.  . fluticasone (FLONASE) 50 MCG/ACT nasal spray Place 1 spray into both nostrils daily as needed for allergies or rhinitis.  . Multiple Vitamins-Minerals (ECHINACEA ACZ PO) Take 1 tablet by mouth daily.  Marland Kitchen omeprazole (PRILOSEC) 20 MG capsule Take 1 capsule (20 mg total) by mouth daily.  . phentermine (ADIPEX-P) 37.5 MG tablet Take 1 tablet (37.5 mg total) by mouth daily before breakfast.  . [START ON 11/29/2016] phentermine (ADIPEX-P) 37.5 MG tablet Take 1 tablet (37.5 mg total) by mouth daily before breakfast.  . RaNITidine HCl (ZANTAC PO) Take 1 tablet by mouth daily as needed (indigestion.).  . [DISCONTINUED] phentermine (ADIPEX-P) 37.5 MG tablet Take 1 tablet (37.5 mg total) by mouth daily before breakfast.   No facility-administered encounter medications on file as of  10/29/2016.     Allergies: Latex and Neosporin [neomycin-bacitracin zn-polymyx]  Body mass index is 53.27 kg/m.  Blood pressure 109/72, pulse 86, height 5' 7.75" (1.721 m), weight (!) 347 lb 12.8 oz (157.8 kg), last menstrual period 10/21/2016.     Review of Systems  Constitutional: Negative for activity change, appetite change, chills, diaphoresis, fatigue, fever and unexpected weight change.  Respiratory: Negative for cough, chest tightness, shortness of breath, wheezing and stridor.   Cardiovascular: Negative for chest pain, palpitations and leg swelling.  Gastrointestinal: Negative for abdominal  distention, abdominal pain, blood in stool, constipation, diarrhea, nausea, rectal pain and vomiting.  Endocrine: Negative for cold intolerance, heat intolerance, polydipsia, polyphagia and polyuria.  Genitourinary: Negative for difficulty urinating and flank pain.  Neurological: Negative for headaches.  Hematological: Does not bruise/bleed easily.  Psychiatric/Behavioral: Negative for sleep disturbance.       Objective:   Physical Exam  Constitutional: She appears well-developed and well-nourished. No distress.  HENT:  Head: Normocephalic and atraumatic.  Right Ear: External ear normal.  Left Ear: External ear normal.  Cardiovascular: Normal rate, regular rhythm, normal heart sounds and intact distal pulses.   No murmur heard. Pulmonary/Chest: Effort normal and breath sounds normal. No respiratory distress. She has no wheezes. She has no rales. She exhibits no tenderness.  Neurological: She is alert.  Skin: Skin is warm and dry. No rash noted. She is not diaphoretic. No erythema. No pallor.  Psychiatric: She has a normal mood and affect. Her behavior is normal. Judgment and thought content normal.  Nursing note and vitals reviewed.      Assessment & Plan:   1. Need for influenza vaccination   2. Morbid obesity (HCC)     Morbid obesity (HCC) Increase water intake, strive for at least 170 ounces/day.   Follow Heart Healthy diet Increase regular exercise.  Recommend at least 30 minutes daily, 5 days per week of walking, jogging, biking, swimming, YouTube/Pinterest workout videos. [place] Controlled Substance Database reviewed-no contraindications noted.  2 months of phentermine provided, follow-up in 2 months. Will consider medication vacation at that time. Current wt 347 lbs wt when she started medical wt loss 388 lbs-GREAT JOB!    FOLLOW-UP:  Return in about 2 months (around 12/29/2016) for Medical Weight Loss.

## 2016-10-29 NOTE — Patient Instructions (Signed)
Heart-Healthy Eating Plan Many factors influence your heart health, including eating and exercise habits. Heart (coronary) risk increases with abnormal blood fat (lipid) levels. Heart-healthy meal planning includes limiting unhealthy fats, increasing healthy fats, and making other small dietary changes. This includes maintaining a healthy body weight to help keep lipid levels within a normal range. What is my plan? Your health care provider recommends that you:  Get no more than __25___% of the total calories in your daily diet from fat.  Limit your intake of saturated fat to less than __5__% of your total calories each day.  Limit the amount of cholesterol in your diet to less than __300__ mg per day.  What types of fat should I choose?  Choose healthy fats more often. Choose monounsaturated and polyunsaturated fats, such as olive oil and canola oil, flaxseeds, walnuts, almonds, and seeds.  Eat more omega-3 fats. Good choices include salmon, mackerel, sardines, tuna, flaxseed oil, and ground flaxseeds. Aim to eat fish at least two times each week.  Limit saturated fats. Saturated fats are primarily found in animal products, such as meats, butter, and cream. Plant sources of saturated fats include palm oil, palm kernel oil, and coconut oil.  Avoid foods with partially hydrogenated oils in them. These contain trans fats. Examples of foods that contain trans fats are stick margarine, some tub margarines, cookies, crackers, and other baked goods. What general guidelines do I need to follow?  Check food labels carefully to identify foods with trans fats or high amounts of saturated fat.  Fill one half of your plate with vegetables and green salads. Eat 4-5 servings of vegetables per day. A serving of vegetables equals 1 cup of raw leafy vegetables,  cup of raw or cooked cut-up vegetables, or  cup of vegetable juice.  Fill one fourth of your plate with whole grains. Look for the word "whole"  as the first word in the ingredient list.  Fill one fourth of your plate with lean protein foods.  Eat 4-5 servings of fruit per day. A serving of fruit equals one medium whole fruit,  cup of dried fruit,  cup of fresh, frozen, or canned fruit, or  cup of 100% fruit juice.  Eat more foods that contain soluble fiber. Examples of foods that contain this type of fiber are apples, broccoli, carrots, beans, peas, and barley. Aim to get 20-30 g of fiber per day.  Eat more home-cooked food and less restaurant, buffet, and fast food.  Limit or avoid alcohol.  Limit foods that are high in starch and sugar.  Avoid fried foods.  Cook foods by using methods other than frying. Baking, boiling, grilling, and broiling are all great options. Other fat-reducing suggestions include: ? Removing the skin from poultry. ? Removing all visible fats from meats. ? Skimming the fat off of stews, soups, and gravies before serving them. ? Steaming vegetables in water or broth.  Lose weight if you are overweight. Losing just 5-10% of your initial body weight can help your overall health and prevent diseases such as diabetes and heart disease.  Increase your consumption of nuts, legumes, and seeds to 4-5 servings per week. One serving of dried beans or legumes equals  cup after being cooked, one serving of nuts equals 1 ounces, and one serving of seeds equals  ounce or 1 tablespoon.  You may need to monitor your salt (sodium) intake, especially if you have high blood pressure. Talk with your health care provider or dietitian to get  more information about reducing sodium. What foods can I eat? Grains  Breads, including [place], white, pita, wheat, raisin, rye, oatmeal, and [place]. Tortillas that are neither fried nor made with lard or trans fat. Low-fat rolls, including hotdog and hamburger buns and English muffins. Biscuits. Muffins. Waffles. Pancakes. Light popcorn. Whole-grain cereals. Flatbread. Melba  toast. Pretzels. Breadsticks. Rusks. Low-fat snacks and crackers, including oyster, saltine, matzo, graham, animal, and rye. Rice and pasta, including brown rice and those that are made with whole wheat. Vegetables All vegetables. Fruits All fruits, but limit coconut. Meats and Other Protein Sources Lean, well-trimmed beef, veal, pork, and lamb. Chicken and [place] without skin. All fish and shellfish. Wild duck, rabbit, pheasant, and venison. Egg whites or low-cholesterol egg substitutes. Dried beans, peas, lentils, and tofu.Seeds and most nuts. Dairy Low-fat or nonfat cheeses, including ricotta, string, and mozzarella. Skim or 1% milk that is liquid, powdered, or evaporated. Buttermilk that is made with low-fat milk. Nonfat or low-fat yogurt. Beverages Mineral water. Diet carbonated beverages. Sweets and Desserts Sherbets and fruit ices. Honey, jam, marmalade, jelly, and syrups. Meringues and gelatins. Pure sugar candy, such as hard candy, jelly beans, gumdrops, mints, marshmallows, and small amounts of dark chocolate. [employer] Eat all sweets and desserts in moderation. Fats and Oils Nonhydrogenated (trans-free) margarines. Vegetable oils, including soybean, sesame, sunflower, olive, peanut, safflower, corn, canola, and cottonseed. Salad dressings or mayonnaise that are made with a vegetable oil. Limit added fats and oils that you use for cooking, baking, salads, and as spreads. Other Cocoa powder. Coffee and tea. All seasonings and condiments. The items listed above may not be a complete list of recommended foods or beverages. Contact your dietitian for more options. What foods are not recommended? Grains Breads that are made with saturated or trans fats, oils, or whole milk. Croissants. Butter rolls. Cheese breads. Sweet rolls. Donuts. Buttered popcorn. Chow mein noodles. High-fat crackers, such as cheese or butter crackers. Meats and Other Protein Sources Fatty meats, such as  hotdogs, short ribs, sausage, spareribs, bacon, ribeye roast or steak, and mutton. High-fat deli meats, such as salami and bologna. Caviar. Domestic duck and goose. Organ meats, such as kidney, liver, sweetbreads, brains, gizzard, chitterlings, and heart. Dairy Cream, sour cream, cream cheese, and creamed cottage cheese. Whole milk cheeses, including blue (bleu), Monte[place] Jack, [place], [place], American, [place], [place], [place], [place], and [place]. Whole or 2% milk that is liquid, evaporated, or condensed. Whole buttermilk. Cream sauce or high-fat cheese sauce. Yogurt that is made from whole milk. Beverages Regular sodas and drinks with added sugar. Sweets and Desserts Frosting. Pudding. Cookies. Cakes other than angel food cake. Candy that has milk chocolate or white chocolate, hydrogenated fat, butter, coconut, or unknown ingredients. Buttered syrups. Full-fat ice cream or ice cream drinks. Fats and Oils Gravy that has suet, meat fat, or shortening. Cocoa butter, hydrogenated oils, palm oil, coconut oil, palm kernel oil. These can often be found in baked products, candy, fried foods, nondairy creamers, and whipped toppings. Solid fats and shortenings, including bacon fat, salt pork, lard, and butter. Nondairy cream substitutes, such as coffee creamers and sour cream substitutes. Salad dressings that are made of unknown oils, cheese, or sour cream. The items listed above may not be a complete list of foods and beverages to avoid. Contact your dietitian for more information. This information is not intended to replace advice given to you by your health care provider. Make sure you discuss any questions you have with your health care  provider. Document Released: 10/17/2007 Document Revised: 07/28/2015 Document Reviewed: 07/01/2013 Elsevier Interactive Patient Education  2017 [employer] Inc.  Increase water intake, strive for at least 170 ounces/day.   Follow Heart Healthy diet Increase regular  exercise.  Recommend at least 30 minutes daily, 5 days per week of walking, jogging, biking, swimming, YouTube/Pinterest workout videos. 2 months of phentermine provided, follow-up in 2 months. Will consider medication vacation at that time. YOU ARE DOING GREAT!!!! NICE TO SEE YOU!

## 2016-10-29 NOTE — Assessment & Plan Note (Addendum)
Increase water intake, strive for at least 170 ounces/day.   Follow Heart Healthy diet Increase regular exercise.  Recommend at least 30 minutes daily, 5 days per week of walking, jogging, biking, swimming, YouTube/Pinterest workout videos. [place] Controlled Substance Database reviewed-no contraindications noted.  2 months of phentermine provided, follow-up in 2 months. Will consider medication vacation at that time. Current wt 347 lbs wt when she started medical wt loss 388 lbs-GREAT JOB!

## 2016-12-24 NOTE — Progress Notes (Deleted)
Subjective:    Patient ID: Sandy Christensen, female    DOB: [birth year], 31 y.o.   MRN: [id number]  HPI:  09/30/2016 OV: [name] is here for f/u: medical wt loss. She has been taking phentermine 37.5mg  daily and following low carb diet.  She has also been drinking >gallon water/day and exercising several times daily with short YouTube/Pinterest workout videos. She denies tobacco/EOTH use.    10/29/2016 OV: [name] is here for f/u on medical wt loss.  She has been on phentermine 37.5mg  since 06/27/16 (total of 4 months) and has lost total of  Goal wt 200 lbs.  She has continued to drink > gallon/water a day and utilizing YouTube/Pinterest workout videos- 3-596min videos BID.  Her diet mostly consists of lean protein and vegetables.  She denies palpitations/insomnia.  She continues to avoid tobacco and rarely consumes ETOH.  12/30/16 OV: [name] is here for f/u:   Patient Care Team    Relationship Specialty Notifications Start End  Sandy Christensen, Sandy D, NP PCP - General Family Medicine  04/24/16     Patient Active Problem List   Diagnosis Date Noted  . Breast pain 06/27/2016  . Acute carpal tunnel syndrome of right wrist 06/27/2016  . Morbid obesity (HCC) 04/24/2016  . Back pain 04/24/2016  . Epidermal cyst of ear 04/24/2016  . Health care maintenance 04/24/2016  . Closed nondisplaced fracture of middle phalanx of left ring finger with routine healing 08/02/2015     No past medical history on file.   No past surgical history on file.   Family History  Problem Relation Age of Onset  . Hypertension Mother   . Cancer Father        skin  . Healthy Sister   . Healthy Brother   . Healthy Sister   . Healthy Sister   . Healthy Brother      Social History   Substance and Sexual Activity  Drug Use No     Social History   Substance and Sexual Activity  Alcohol Use Yes   Comment: Once a month     Social History   Tobacco Use  Smoking Status Former Smoker   . Packs/day: 0.50  . Years: 2.00  . Pack years: 1.00  . Types: Cigarettes  . Last attempt to quit: 01/21/1998  . Years since quitting: 18.9  Smokeless Tobacco Never Used     Outpatient Encounter Medications as of 12/30/2016  Medication Sig  . Ascorbic Acid (VITAMIN C PO) Take 1 tablet by mouth daily.  Marland Kitchen. aspirin-acetaminophen-caffeine (EXCEDRIN MIGRAINE) 250-250-65 MG tablet Take 2 tablets by mouth every 6 (six) hours as needed for headache.  . Biotin w/ Vitamins C & E (HAIR/SKIN/NAILS PO) Take 1 tablet by mouth daily.   . cetirizine (ZYRTEC) 10 MG tablet Take 10 mg by mouth daily.  . cholecalciferol (VITAMIN Christensen) 1000 units tablet Take 2,000 Units by mouth daily.  . fluticasone (FLONASE) 50 MCG/ACT nasal spray Place 1 spray into both nostrils daily as needed for allergies or rhinitis.  . Multiple Vitamins-Minerals (ECHINACEA ACZ PO) Take 1 tablet by mouth daily.  Marland Kitchen. omeprazole (PRILOSEC) 20 MG capsule Take 1 capsule (20 mg total) by mouth daily.  . phentermine (ADIPEX-P) 37.5 MG tablet Take 1 tablet (37.5 mg total) by mouth daily before breakfast.  . phentermine (ADIPEX-P) 37.5 MG tablet Take 1 tablet (37.5 mg total) by mouth daily before breakfast.  . RaNITidine HCl (ZANTAC PO) Take 1 tablet by mouth daily as  needed (indigestion.).   No facility-administered encounter medications on file as of 12/30/2016.     Allergies: Latex and Neosporin [neomycin-bacitracin zn-polymyx]  There is no height or weight on file to calculate BMI.  There were no vitals taken for this visit.     Review of Systems  Constitutional: Negative for activity change, appetite change, chills, diaphoresis, fatigue, fever and unexpected weight change.  Respiratory: Negative for cough, chest tightness, shortness of breath, wheezing and stridor.   Cardiovascular: Negative for chest pain, palpitations and leg swelling.  Gastrointestinal: Negative for abdominal distention, abdominal pain, blood in stool,  constipation, diarrhea, nausea, rectal pain and vomiting.  Endocrine: Negative for cold intolerance, heat intolerance, polydipsia, polyphagia and polyuria.  Genitourinary: Negative for difficulty urinating and flank pain.  Neurological: Negative for headaches.  Hematological: Does not bruise/bleed easily.  Psychiatric/Behavioral: Negative for sleep disturbance.       Objective:   Physical Exam  Constitutional: She appears well-developed and well-nourished. No distress.  HENT:  Head: Normocephalic and atraumatic.  Right Ear: External ear normal.  Left Ear: External ear normal.  Cardiovascular: Normal rate, regular rhythm, normal heart sounds and intact distal pulses.  No murmur heard. Pulmonary/Chest: Effort normal and breath sounds normal. No respiratory distress. She has no wheezes. She has no rales. She exhibits no tenderness.  Neurological: She is alert.  Skin: Skin is warm and dry. No rash noted. She is not diaphoretic. No erythema. No pallor.  Psychiatric: She has a normal mood and affect. Her behavior is normal. Judgment and thought content normal.  Nursing note and vitals reviewed.      Assessment & Plan:   No diagnosis found.  No problem-specific Assessment & Plan notes found for this encounter.    FOLLOW-UP:  No Follow-up on file.

## 2016-12-30 ENCOUNTER — Ambulatory Visit: Payer: Managed Care, Other (non HMO) | Admitting: Adult Health

## 2017-02-05 ENCOUNTER — Ambulatory Visit: Payer: Managed Care, Other (non HMO) | Admitting: Adult Health

## 2017-02-18 NOTE — Progress Notes (Signed)
Subjective:    Patient ID: Sandy Christensen, female    DOB: 02/25/1985, 32 y.o.   MRN: [id number]  HPI:  09/30/2016 OV: Sandy Christensen is here for f/u: medical wt loss. She has been taking phentermine 37.5mg  daily and following low carb diet.  She has also been drinking >gallon water/day and exercising several times daily with short YouTube/Pinterest workout videos. She denies tobacco/EOTH use.   OV 10/29/2016 OV: Sandy Christensen is here for f/u on medical wt loss.  She has been on phentermine 37.5mg  since 06/27/16 (total of 4 months) and has lost total of 6 lbs Goal wt 200 lbs.  She has continued to drink > gallon/water a day and utilizing YouTube/Pinterest workout videos- 3-52min videos BID.  Her diet mostly consists of lean protein and vegetables.  She denies palpitations/insomnia.  She continues to avoid tobacco and rarely consumes ETOH.  02/24/17 OV: Sandy Christensen is here for  F/u medical wt loss.  She completed last phentermine rx 3 weeks ago and her appetite has sig increased- 10 lb wt gain since last OV 10/18. She reports phentermine "really helps me make better food choices b/c I am not starving and I can think about what is the best thing to eat". She continues to use YouTube 3-5 min exercise videos daily and has added walking program 3 days a week 10-15  mins She continues to avoid tobacco/ETOH She drinks water all day and she has dramatically reduce saturated fat/CHO/sugar intake She has one new complaint- grief r/t to her nephew suddenly passing away 6 months ago. She reports strong support system and denies thoughts of harming herself/others.  Patient Care Team    Relationship Specialty Notifications Start End  Julaine Fusi, NP PCP - General Family Medicine  04/24/16     Patient Active Problem List   Diagnosis Date Noted  . Grief 02/24/2017  . Breast pain 06/27/2016  . Acute carpal tunnel syndrome of right wrist 06/27/2016  . Morbid obesity (HCC) 04/24/2016  . Back pain  04/24/2016  . Epidermal cyst of ear 04/24/2016  . Health care maintenance 04/24/2016  . Closed nondisplaced fracture of middle phalanx of left ring finger with routine healing 08/02/2015     History reviewed. No pertinent past medical history.   History reviewed. No pertinent surgical history.   Family History  Problem Relation Age of Onset  . Hypertension Mother   . Cancer Father        skin  . Healthy Sister   . Healthy Brother   . Healthy Sister   . Healthy Sister   . Healthy Brother      Social History   Substance and Sexual Activity  Drug Use No     Social History   Substance and Sexual Activity  Alcohol Use Yes   Comment: Once a month     Social History   Tobacco Use  Smoking Status Former Smoker  . Packs/day: 0.50  . Years: 2.00  . Pack years: 1.00  . Types: Cigarettes  . Last attempt to quit: 01/21/1998  . Years since quitting: 19.1  Smokeless Tobacco Never Used     Outpatient Encounter Medications as of 02/24/2017  Medication Sig  . Ascorbic Acid (VITAMIN C PO) Take 1 tablet by mouth daily.  Marland Kitchen aspirin-acetaminophen-caffeine (EXCEDRIN MIGRAINE) 250-250-65 MG tablet Take 2 tablets by mouth every 6 (six) hours as needed for headache.  . Biotin w/ Vitamins C & E (HAIR/SKIN/NAILS PO) Take 1 tablet by mouth daily.   Marland Kitchen  cetirizine (ZYRTEC) 10 MG tablet Take 10 mg by mouth daily.  . cholecalciferol (VITAMIN D) 1000 units tablet Take 2,000 Units by mouth daily.  Marland Kitchen. esomeprazole (NEXIUM) 20 MG capsule Take 20 mg by mouth 2 (two) times daily before a meal.  . fluticasone (FLONASE) 50 MCG/ACT nasal spray Place 1 spray into both nostrils daily as needed for allergies or rhinitis.  . Multiple Vitamins-Minerals (ECHINACEA ACZ PO) Take 1 tablet by mouth daily.  Melene Muller. [START ON 03/24/2017] phentermine (ADIPEX-P) 37.5 MG tablet Take 1 tablet (37.5 mg total) by mouth daily before breakfast.  . RaNITidine HCl (ZANTAC PO) Take 1 tablet by mouth daily as needed  (indigestion.).  . [DISCONTINUED] omeprazole (PRILOSEC) 20 MG capsule Take 1 capsule (20 mg total) by mouth daily.  . [DISCONTINUED] phentermine (ADIPEX-P) 37.5 MG tablet Take 1 tablet (37.5 mg total) by mouth daily before breakfast.  . [DISCONTINUED] phentermine (ADIPEX-P) 37.5 MG tablet Take 1 tablet (37.5 mg total) by mouth daily before breakfast.  . [DISCONTINUED] phentermine (ADIPEX-P) 37.5 MG tablet Take 1 tablet (37.5 mg total) by mouth daily before breakfast.   No facility-administered encounter medications on file as of 02/24/2017.     Allergies: Latex and Neosporin [neomycin-bacitracin zn-polymyx]  Body mass index is 54.81 kg/m.  Blood pressure 117/77, pulse 67, height 5' 7.75" (1.721 m), weight (!) 357 lb 12.8 oz (162.3 kg), last menstrual period 02/19/2017.     Review of Systems  Constitutional: Negative for activity change, appetite change, chills, diaphoresis, fatigue, fever and unexpected weight change.  Respiratory: Negative for cough, chest tightness, shortness of breath, wheezing and stridor.   Cardiovascular: Negative for chest pain, palpitations and leg swelling.  Gastrointestinal: Negative for abdominal distention, abdominal pain, blood in stool, constipation, diarrhea, nausea, rectal pain and vomiting.  Endocrine: Negative for cold intolerance, heat intolerance, polydipsia, polyphagia and polyuria.  Genitourinary: Negative for difficulty urinating and flank pain.  Neurological: Negative for headaches.  Hematological: Does not bruise/bleed easily.  Psychiatric/Behavioral: Negative for sleep disturbance.       Objective:   Physical Exam  Constitutional: She appears well-developed and well-nourished. No distress.  HENT:  Head: Normocephalic and atraumatic.  Right Ear: External ear normal.  Left Ear: External ear normal.  Cardiovascular: Normal rate, regular rhythm, normal heart sounds and intact distal pulses.  No murmur heard. Pulmonary/Chest: Effort normal  and breath sounds normal. No respiratory distress. She has no wheezes. She has no rales. She exhibits no tenderness.  Neurological: She is alert.  Skin: Skin is warm and dry. No rash noted. She is not diaphoretic. No erythema. No pallor.  Psychiatric: She has a normal mood and affect. Her behavior is normal. Judgment and thought content normal.  Nursing note and vitals reviewed.      Assessment & Plan:   1. Grief   2. Morbid obesity (HCC)     Morbid obesity (HCC) [facility] [place] [place]- no contraindications noted 2 Rx's of Phentermine provided. Increase water intake, strive for at least 150 ounces/day.   Follow Heart Healthy diet Increase regular exercise.  Recommend at least 30 minutes daily, 5 days per week of walking, jogging, biking, swimming, YouTube/Pinterest workout videos. Please follow-up in 2 months, sooner if needed.  Grief Girl friend passed away 6 years and her nephew was killed 6 months ago and she is struggling with grief. She denies thoughts of harming herself/others. She reports strong support system. Mental health referral placed.    FOLLOW-UP:  Return in about 2 months (  around 04/24/2017) for Medical Weight Loss.

## 2017-02-24 ENCOUNTER — Encounter: Payer: Self-pay | Admitting: Adult Health

## 2017-02-24 ENCOUNTER — Ambulatory Visit: Payer: Managed Care, Other (non HMO) | Admitting: Adult Health

## 2017-02-24 VITALS — BP 117/77 | HR 67 | Ht 67.75 in | Wt 357.8 lb

## 2017-02-24 DIAGNOSIS — F4321 Adjustment disorder with depressed mood: Secondary | ICD-10-CM | POA: Diagnosis not present

## 2017-02-24 MED ORDER — PHENTERMINE HCL 37.5 MG PO TABS: 37.5000 mg | ORAL_TABLET | Freq: Every day | ORAL | 0 refills | Status: DC

## 2017-02-24 NOTE — Assessment & Plan Note (Addendum)
10 lb wt gain since [facility] reviewed- no contraindications noted 2 Rx's of Phentermine provided. Increase water intake, strive for at least 150 ounces/day.   Follow Heart Healthy diet Increase regular exercise.  Recommend at least 30 minutes daily, 5 days per week of walking, jogging, biking, swimming, YouTube/Pinterest workout videos. Please follow-up in 2 months, sooner if needed.

## 2017-02-24 NOTE — Assessment & Plan Note (Signed)
Girl friend passed away 6 years and her nephew was killed 6 months ago and she is struggling with grief. She denies thoughts of harming herself/others. She reports strong support system. Mental health referral placed.

## 2017-02-24 NOTE — Patient Instructions (Signed)
Exercising to Lose Weight Exercising can help you to lose weight. In order to lose weight through exercise, you need to do vigorous-intensity exercise. You can tell that you are exercising with vigorous intensity if you are breathing very hard and fast and cannot hold a conversation while exercising. Moderate-intensity exercise helps to maintain your current weight. You can tell that you are exercising at a moderate level if you have a higher heart rate and faster breathing, but you are still able to hold a conversation. How often should I exercise? Choose an activity that you enjoy and set realistic goals. Your health care provider can help you to make an activity plan that works for you. Exercise regularly as directed by your health care provider. This may include:  Doing resistance training twice each week, such as: ? Push-ups. ? Sit-ups. ? Lifting weights. ? Using resistance bands.  Doing a given intensity of exercise for a given amount of time. Choose from these options: ? 150 minutes of moderate-intensity exercise every week. ? 75 minutes of vigorous-intensity exercise every week. ? A mix of moderate-intensity and vigorous-intensity exercise every week.  Children, pregnant women, people who are out of shape, people who are overweight, and older adults may need to consult a health care provider for individual recommendations. If you have any sort of medical condition, be sure to consult your health care provider before starting a new exercise program. What are some activities that can help me to lose weight?  Walking at a rate of at least 4.5 miles an hour.  Jogging or running at a rate of 5 miles per hour.  Biking at a rate of at least 10 miles per hour.  Lap swimming.  Roller-skating or in-line skating.  Cross-country skiing.  Vigorous competitive sports, such as football, basketball, and soccer.  Jumping rope.  Aerobic dancing. How can I be more active in my day-to-day  activities?  Use the stairs instead of the elevator.  Take a walk during your lunch break.  If you drive, park your car farther away from work or school.  If you take public transportation, get off one stop early and walk the rest of the way.  Make all of your phone calls while standing up and walking around.  Get up, stretch, and walk around every 30 minutes throughout the day. What guidelines should I follow while exercising?  Do not exercise so much that you hurt yourself, feel dizzy, or get very short of breath.  Consult your health care provider prior to starting a new exercise program.  Wear comfortable clothes and shoes with good support.  Drink plenty of water while you exercise to prevent dehydration or heat stroke. Body water is lost during exercise and must be replaced.  Work out until you breathe faster and your heart beats faster. This information is not intended to replace advice given to you by your health care provider. Make sure you discuss any questions you have with your health care provider. Document Released: 02/09/2010 Document Revised: 06/15/2015 Document Reviewed: 06/10/2013 Elsevier Interactive Patient Education  [employer]2018 [employer]  Please continue all medications as directed. Increase water intake, strive for at least 150 ounces/day.   Follow Heart Healthy diet Increase regular exercise.  Recommend at least 30 minutes daily, 5 days per week of walking, jogging, biking, swimming, YouTube/Pinterest workout videos. Mental health referral placed. Please follow-up in 2 months, sooner if needed. NICE TO SEE YOU!

## 2017-05-05 ENCOUNTER — Encounter: Payer: Self-pay | Admitting: Adult Health

## 2017-05-05 ENCOUNTER — Ambulatory Visit (INDEPENDENT_AMBULATORY_CARE_PROVIDER_SITE_OTHER): Payer: Managed Care, Other (non HMO) | Admitting: Adult Health

## 2017-05-05 VITALS — BP 122/81 | HR 84 | Ht 67.75 in | Wt 346.6 lb

## 2017-05-05 DIAGNOSIS — Z0000 Encounter for general adult medical examination without abnormal findings: Secondary | ICD-10-CM | POA: Diagnosis not present

## 2017-05-05 MED ORDER — PHENTERMINE HCL 37.5 MG PO TABS: 37.5000 mg | ORAL_TABLET | Freq: Every day | ORAL | 0 refills | Status: DC

## 2017-05-05 NOTE — Assessment & Plan Note (Addendum)
Body mass index is 53.09 kg/m. [place] Controlled Substance Database reviewed- no aberrancies noted. Please continue Phentermine as directed. When you need refill on Phentermine, please call your pharmacy and we will send in refill around mid May. Continue regular walking, healthy eating, and excellent water intake. She has lost 11 lbs since last OV on 02/24/17 Follow-up in 2 months. GREAT JOB!

## 2017-05-05 NOTE — Progress Notes (Signed)
Subjective:    Patient ID: Sandy Christensen, female    DOB: 1985/12/31, 32 y.o.   MRN: [id number]  HPI:  09/30/2016 OV: Sandy Christensen is here for f/u: medical wt loss. She has been taking phentermine 37.5mg  daily and following low carb diet.  She has also been drinking >gallon water/day and exercising several times daily with short YouTube/Pinterest workout videos. She denies tobacco/EOTH use.   OV 10/29/2016 OV: Sandy Christensen is here for f/u on medical wt loss.  She has been on phentermine 37.5mg  since 06/27/16 (total of 4 months) and has lost total of 6 lbs Goal wt 200 lbs.  She has continued to drink > gallon/water a day and utilizing YouTube/Pinterest workout videos- 3-43min videos BID.  Her diet mostly consists of lean protein and vegetables.  She denies palpitations/insomnia.  She continues to avoid tobacco and rarely consumes ETOH.  02/24/17 OV: Sandy Christensen is here for  F/u medical wt loss.  She completed last phentermine rx 3 weeks ago and her appetite has sig increased- 10 lb wt gain since last OV 10/18. She reports phentermine "really helps me make better food choices b/c I am not starving and I can think about what is the best thing to eat". She continues to use YouTube 3-5 min exercise videos daily and has added walking program 3 days a week 10-15  mins She continues to avoid tobacco/ETOH She drinks water all day and she has dramatically reduce saturated fat/CHO/sugar intake She has one new complaint- grief r/t to her nephew suddenly passing away 6 months ago. She reports strong support system and denies thoughts of harming herself/others.  05/05/17 OV: Sandy Christensen is here for f/u: medical wt loss She has been taking Phentermine 37.5mg  Q am She denies CP/dyspnea/dizziness/HA/palpitations/insomnia. 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 She has been staying well hydrated with water and has completely eliminated soda/sweat tea. She denies tobacco use, and estimates to drink 1-2 alcoholic drinks/month She reports that she and her wife will be trying to conceive via sperm donor, she requests referral to OB/GYN  Patient Care Team    Relationship Specialty Notifications Start End  Julaine Fusi, NP PCP - General Family Medicine  04/24/16     Patient Active Problem List   Diagnosis Date Noted  . Grief 02/24/2017  . Breast pain 06/27/2016  . Acute carpal tunnel syndrome of right wrist 06/27/2016  . Morbid obesity (HCC) 04/24/2016  . Back pain 04/24/2016  . Epidermal cyst of ear 04/24/2016  . Health care maintenance 04/24/2016  . Closed nondisplaced fracture of middle phalanx of left ring finger with routine healing 08/02/2015     History reviewed. No pertinent past medical history.   History reviewed. No pertinent surgical history.   Family History  Problem Relation Age of Onset  . Hypertension Mother   . Cancer Father        skin  . Healthy Sister   . Healthy Brother   . Healthy Sister   . Healthy Sister   . Healthy Brother      Social History   Substance and Sexual Activity  Drug Use No     Social History   Substance and Sexual Activity  Alcohol Use Yes   Comment: Once a month     Social History   Tobacco Use  Smoking Status Former  Smoker  . Packs/day: 0.50  . Years: 2.00  . Pack years: 1.00  . Types: Cigarettes  . Last attempt to quit: 01/21/1998  . Years since quitting: 19.2  Smokeless Tobacco Never Used     Outpatient Encounter Medications as of 05/05/2017  Medication Sig  . Ascorbic Acid (VITAMIN C PO) Take 1 tablet by mouth daily.  Marland Kitchen. aspirin-acetaminophen-caffeine (EXCEDRIN MIGRAINE) 250-250-65 MG tablet Take 2 tablets by mouth every 6 (six) hours as needed for headache.  . Biotin w/ Vitamins C & E (HAIR/SKIN/NAILS PO) Take 1 tablet by mouth daily.   . cetirizine  (ZYRTEC) 10 MG tablet Take 10 mg by mouth daily.  . cholecalciferol (VITAMIN D) 1000 units tablet Take 2,000 Units by mouth daily.  Marland Kitchen. esomeprazole (NEXIUM) 20 MG capsule Take 20 mg by mouth 2 (two) times daily before a meal.  . fluticasone (FLONASE) 50 MCG/ACT nasal spray Place 1 spray into both nostrils daily as needed for allergies or rhinitis.  . Multiple Vitamins-Minerals (ECHINACEA ACZ PO) Take 1 tablet by mouth daily.  . phentermine (ADIPEX-P) 37.5 MG tablet Take 1 tablet (37.5 mg total) by mouth daily before breakfast.  . RaNITidine HCl (ZANTAC PO) Take 1 tablet by mouth daily as needed (indigestion.).  . [DISCONTINUED] phentermine (ADIPEX-P) 37.5 MG tablet Take 1 tablet (37.5 mg total) by mouth daily before breakfast.  . [DISCONTINUED] phentermine (ADIPEX-P) 37.5 MG tablet Take 1 tablet (37.5 mg total) by mouth daily before breakfast.   No facility-administered encounter medications on file as of 05/05/2017.     Allergies: Latex and Neosporin [neomycin-bacitracin zn-polymyx]  Body mass index is 53.09 kg/m.  Blood pressure 122/81, pulse 84, height 5' 7.75" (1.721 m), weight (!) 346 lb 9.6 oz (157.2 kg), last menstrual period 04/26/2017, SpO2 97 %.  Review of Systems  Constitutional: Negative for activity change, appetite change, chills, diaphoresis, fatigue, fever and unexpected weight change.  Respiratory: Negative for cough, chest tightness, shortness of breath, wheezing and stridor.   Cardiovascular: Negative for chest pain, palpitations and leg swelling.  Gastrointestinal: Negative for abdominal distention, abdominal pain, blood in stool, constipation, diarrhea, nausea, rectal pain and vomiting.  Endocrine: Negative for cold intolerance, heat intolerance, polydipsia, polyphagia and polyuria.  Genitourinary: Negative for difficulty urinating and flank pain.  Neurological: Negative for headaches.  Hematological: Does not bruise/bleed easily.  Psychiatric/Behavioral: Negative for  sleep disturbance.       Objective:   Physical Exam  Constitutional: She appears well-developed and well-nourished. No distress.  HENT:  Head: Normocephalic and atraumatic.  Right Ear: External ear normal.  Left Ear: External ear normal.  Cardiovascular: Normal rate, regular rhythm, normal heart sounds and intact distal pulses.  No murmur heard. Pulmonary/Chest: Effort normal and breath sounds normal. No respiratory distress. She has no wheezes. She has no rales. She exhibits no tenderness.  Neurological: She is alert.  Skin: Skin is warm and dry. No rash noted. She is not diaphoretic. No erythema. No pallor.  Psychiatric: She has a normal mood and affect. Her behavior is normal. Judgment and thought content normal.  Nursing note and vitals reviewed.      Assessment & Plan:   1. Health care maintenance   2. Morbid obesity (HCC)     Morbid obesity (HCC) Body mass index is 53.09 kg/m. [place] Cont[place] Substance Database reviewed- no aberrancies noted. Please continue Phentermine as directed. When you need refill on Phentermine, please call your pharmacy and we will send in refill around mid May. Continue regular  walking, healthy eating, and excellent water intake. She has lost 11 lbs since last OV on 02/24/17 Follow-up in 2 months. GREAT JOB!  FOLLOW-UP:  Return in about 2 months (around 07/05/2017) for Medical Weight Loss.

## 2017-05-05 NOTE — Patient Instructions (Addendum)
Mediterranean Diet A Mediterranean diet refers to food and lifestyle choices that are based on the traditions of countries located on the [place]. This way of eating has been shown to help prevent certain conditions and improve outcomes for people who have chronic diseases, like kidney disease and heart disease. What are tips for following this plan? Lifestyle  Cook and eat meals together with your family, when possible.  Drink enough fluid to keep your urine clear or pale yellow.  Be physically active every day. This includes: ? Aerobic exercise like running or swimming. ? Leisure activities like gardening, walking, or housework.  Get 7-8 hours of sleep each night.  If recommended by your health care provider, drink red wine in moderation. This means 1 glass a day for nonpregnant women and 2 glasses a day for men. A glass of wine equals 5 oz (150 mL). Reading food labels  Check the serving size of packaged foods. For foods such as rice and pasta, the serving size refers to the amount of cooked product, not dry.  Check the total fat in packaged foods. Avoid foods that have saturated fat or trans fats.  Check the ingredients list for added sugars, such as corn syrup. Shopping  At the grocery store, buy most of your food from the areas near the walls of the store. This includes: ? Fresh fruits and vegetables (produce). ? Grains, beans, nuts, and seeds. Some of these may be available in unpackaged forms or large amounts (in bulk). ? Fresh seafood. ? Poultry and eggs. ? Low-fat dairy products.  Buy whole ingredients instead of prepackaged foods.  Buy fresh fruits and vegetables in-season from local farmers markets.  Buy frozen fruits and vegetables in resealable bags.  If you do not have access to quality fresh seafood, buy precooked frozen shrimp or canned fish, such as tuna, salmon, or sardines.  Buy small amounts of raw or cooked vegetables, salads, or olives from the  deli or salad bar at your store.  Stock your pantry so you always have certain foods on hand, such as olive oil, canned tuna, canned tomatoes, rice, pasta, and beans. Cooking  Cook foods with extra-virgin olive oil instead of using butter or other vegetable oils.  Have meat as a side dish, and have vegetables or grains as your main dish. This means having meat in small portions or adding small amounts of meat to foods like pasta or stew.  Use beans or vegetables instead of meat in common dishes like chili or lasagna.  Experiment with different cooking methods. Try roasting or broiling vegetables instead of steaming or sauteing them.  Add frozen vegetables to soups, stews, pasta, or rice.  Add nuts or seeds for added healthy fat at each meal. You can add these to yogurt, salads, or vegetable dishes.  Marinate fish or vegetables using olive oil, lemon juice, garlic, and fresh herbs. Meal planning  Plan to eat 1 vegetarian meal one day each week. Try to work up to 2 vegetarian meals, if possible.  Eat seafood 2 or more times a week.  Have healthy snacks readily available, such as: ? Vegetable sticks with hummus. ? Greek yogurt. ? Fruit and nut trail mix.  Eat balanced meals throughout the week. This includes: ? Fruit: 2-3 servings a day ? Vegetables: 4-5 servings a day ? Low-fat dairy: 2 servings a day ? Fish, poultry, or lean meat: 1 serving a day ? Beans and legumes: 2 or more servings a week ? Nuts   and seeds: 1-2 servings a day ? Whole grains: 6-8 servings a day ? Extra-virgin olive oil: 3-4 servings a day  Limit red meat and sweets to only a few servings a month What are my food choices?  Mediterranean diet ? Recommended ? Grains: Whole-grain pasta. Brown rice. Bulgar wheat. Polenta. Couscous. Whole-wheat bread. [place]. ? Vegetables: Artichokes. Beets. Broccoli. Cabbage. Carrots. Eggplant. Green beans. Chard. Kale. Spinach. Onions. Leeks. Peas. Squash.  Tomatoes. Peppers. Radishes. ? Fruits: Apples. Apricots. Avocado. Berries. Bananas. Cherries. Dates. Figs. Grapes. Lemons. Melon. Oranges. Peaches. Plums. Pomegranate. ? Meats and other protein foods: Beans. Almonds. Sunflower seeds. Pine nuts. Peanuts. [place]. Salmon. Scallops. Shrimp. [place]. Tilapia. Clams. Oysters. Eggs. ? Dairy: Low-fat milk. Cheese. Greek yogurt. ? Beverages: Water. Red wine. Herbal tea. ? Fats and oils: Extra virgin olive oil. Avocado oil. Grape seed oil. ? Sweets and desserts: [place] yogurt with honey. Baked apples. Poached pears. Trail mix. ? Seasoning and other foods: Basil. Cilantro. Coriander. Cumin. Mint. Parsley. Sage. Rosemary. Tarragon. Garlic. Oregano. Thyme. Pepper. Balsalmic vinegar. Tahini. Hummus. Tomato sauce. Olives. Mushrooms. ? Limit these ? Grains: Prepackaged pasta or rice dishes. Prepackaged cereal with added sugar. ? Vegetables: Deep fried potatoes (french fries). ? Fruits: Fruit canned in syrup. ? Meats and other protein foods: Beef. Pork. Lamb. Poultry with skin. Hot dogs. Berniece Salines. ? Dairy: Ice cream. Sour cream. Whole milk. ? Beverages: Juice. Sugar-sweetened soft drinks. Beer. Liquor and spirits. ? Fats and oils: Butter. Canola oil. Vegetable oil. Beef fat (tallow). Lard. ? Sweets and desserts: Cookies. Cakes. Pies. Candy. ? Seasoning and other foods: Mayonnaise. Premade sauces and marinades. ? The items listed may not be a complete list. Talk with your dietitian about what dietary choices are right for you. Summary  The Mediterranean diet includes both food and lifestyle choices.  Eat a variety of fresh fruits and vegetables, beans, nuts, seeds, and whole grains.  Limit the amount of red meat and sweets that you eat.  Talk with your health care provider about whether it is safe for you to drink red wine in moderation. This means 1 glass a day for nonpregnant women and 2 glasses a day for men. A glass of wine equals 5 oz (150 mL). This information  is not intended to replace advice given to you by your health care provider. Make sure you discuss any questions you have with your health care provider. Document Released: 08/31/2015 Document Revised: 10/03/2015 Document Reviewed: 08/31/2015 Elsevier Interactive Patient Education  2018 [employer].    Exercising to [employer] Exercising can help you to lose weight. In order to lose weight through exercise, you need to do vigorous-intensity exercise. You can tell that you are exercising with vigorous intensity if you are breathing very hard and fast and cannot hold a conversation while exercising. Moderate-intensity exercise helps to maintain your current weight. You can tell that you are exercising at a moderate level if you have a higher heart rate and faster breathing, but you are still able to hold a conversation. How often should I exercise? Choose an activity that you enjoy and set realistic goals. Your health care provider can help you to make an activity plan that works for you. Exercise regularly as directed by your health care provider. This may include:  Doing resistance training twice each week, such as: ? Push-ups. ? Sit-ups. ? Lifting weights. ? Using resistance bands.  Doing a given intensity of exercise for a given amount of time. Choose from these options: ?  150 minutes of moderate-intensity exercise every week. ? 75 minutes of vigorous-intensity exercise every week. ? A mix of moderate-intensity and vigorous-intensity exercise every week.  Children, pregnant women, people who are out of shape, people who are overweight, and older adults may need to consult a health care provider for individual recommendations. If you have any sort of medical condition, be sure to consult your health care provider before starting a new exercise program. What are some activities that can help me to lose weight?  Walking at a rate of at least 4.5 miles an hour.  Jogging or running at a  rate of 5 miles per hour.  Biking at a rate of at least 10 miles per hour.  Lap swimming.  Roller-skating or in-line skating.  Cross-country skiing.  Vigorous competitive sports, such as football, basketball, and soccer.  Jumping rope.  Aerobic dancing. How can I be more active in my day-to-day activities?  Use the stairs instead of the elevator.  Take a walk during your lunch break.  If you drive, park your car farther away from work or school.  If you take public transportation, get off one stop early and walk the rest of the way.  Make all of your phone calls while standing up and walking around.  Get up, stretch, and walk around every 30 minutes throughout the day. What guidelines should I follow while exercising?  Do not exercise so much that you hurt yourself, feel dizzy, or get very short of breath.  Consult your health care provider prior to starting a new exercise program.  Wear comfortable clothes and shoes with good support.  Drink plenty of water while you exercise to prevent dehydration or heat stroke. Body water is lost during exercise and must be replaced.  Work out until you breathe faster and your heart beats faster. This information is not intended to replace advice given to you by your health care provider. Make sure you discuss any questions you have with your health care provider. Document Released: 02/09/2010 Document Revised: 06/15/2015 Document Reviewed: 06/10/2013 Elsevier Interactive Patient Education  2018 [employer]  Please continue Phentermine as directed. When you need refill on Phentermine, please call your pharmacy and we will send in refill around mid May. Continue regular walking, healthy eating, and excellent water intake. Referral to OB/GYN Follow-up in 2 months. GREAT JOB!

## 2017-06-04 ENCOUNTER — Other Ambulatory Visit: Payer: Self-pay | Admitting: Adult Health

## 2017-06-04 NOTE — Telephone Encounter (Signed)
Please review and refill if appropriate.  T. Nelson, CMA  

## 2017-07-07 ENCOUNTER — Encounter: Payer: Self-pay | Admitting: Adult Health

## 2017-07-07 ENCOUNTER — Ambulatory Visit: Payer: Managed Care, Other (non HMO) | Admitting: Adult Health

## 2017-07-07 MED ORDER — PHENTERMINE HCL 37.5 MG PO TABS: ORAL_TABLET | ORAL | 0 refills | Status: DC

## 2017-07-07 NOTE — Assessment & Plan Note (Signed)
Body mass index is 50.69 kg/m. Current wt 331 She has lost 69 lbs [facility] [facility]-no aberrancies noted. Two final months of phentermine 37.5mg  QAM provided Would like to transition her to once daily injectable Saxenda in 2 months Please call clinic in 6 weeks and we will send in Saxenda 3 month f/u

## 2017-07-07 NOTE — Patient Instructions (Signed)
Mediterranean Diet A Mediterranean diet refers to food and lifestyle choices that are based on the traditions of countries located on the [place]. This way of eating has been shown to help prevent certain conditions and improve outcomes for people who have chronic diseases, like kidney disease and heart disease. What are tips for following this plan? Lifestyle  Cook and eat meals together with your family, when possible.  Drink enough fluid to keep your urine clear or pale yellow.  Be physically active every day. This includes: ? Aerobic exercise like running or swimming. ? Leisure activities like gardening, walking, or housework.  Get 7-8 hours of sleep each night.  If recommended by your health care provider, drink red wine in moderation. This means 1 glass a day for nonpregnant women and 2 glasses a day for men. A glass of wine equals 5 oz (150 mL). Reading food labels  Check the serving size of packaged foods. For foods such as rice and pasta, the serving size refers to the amount of cooked product, not dry.  Check the total fat in packaged foods. Avoid foods that have saturated fat or trans fats.  Check the ingredients list for added sugars, such as corn syrup. Shopping  At the grocery store, buy most of your food from the areas near the walls of the store. This includes: ? Fresh fruits and vegetables (produce). ? Grains, beans, nuts, and seeds. Some of these may be available in unpackaged forms or large amounts (in bulk). ? Fresh seafood. ? Poultry and eggs. ? Low-fat dairy products.  Buy whole ingredients instead of prepackaged foods.  Buy fresh fruits and vegetables in-season from local farmers markets.  Buy frozen fruits and vegetables in resealable bags.  If you do not have access to quality fresh seafood, buy precooked frozen shrimp or canned fish, such as tuna, salmon, or sardines.  Buy small amounts of raw or cooked vegetables, salads, or olives from the  deli or salad bar at your store.  Stock your pantry so you always have certain foods on hand, such as olive oil, canned tuna, canned tomatoes, rice, pasta, and beans. Cooking  Cook foods with extra-virgin olive oil instead of using butter or other vegetable oils.  Have meat as a side dish, and have vegetables or grains as your main dish. This means having meat in small portions or adding small amounts of meat to foods like pasta or stew.  Use beans or vegetables instead of meat in common dishes like chili or lasagna.  Experiment with different cooking methods. Try roasting or broiling vegetables instead of steaming or sauteing them.  Add frozen vegetables to soups, stews, pasta, or rice.  Add nuts or seeds for added healthy fat at each meal. You can add these to yogurt, salads, or vegetable dishes.  Marinate fish or vegetables using olive oil, lemon juice, garlic, and fresh herbs. Meal planning  Plan to eat 1 vegetarian meal one day each week. Try to work up to 2 vegetarian meals, if possible.  Eat seafood 2 or more times a week.  Have healthy snacks readily available, such as: ? Vegetable sticks with hummus. ? Greek yogurt. ? Fruit and nut trail mix.  Eat balanced meals throughout the week. This includes: ? Fruit: 2-3 servings a day ? Vegetables: 4-5 servings a day ? Low-fat dairy: 2 servings a day ? Fish, poultry, or lean meat: 1 serving a day ? Beans and legumes: 2 or more servings a week ? Nuts   and seeds: 1-2 servings a day ? Whole grains: 6-8 servings a day ? Extra-virgin olive oil: 3-4 servings a day  Limit red meat and sweets to only a few servings a month What are my food choices?  Mediterranean diet ? Recommended ? Grains: Whole-grain pasta. Brown rice. Bulgar wheat. Polenta. Couscous. Whole-wheat bread. [place]. ? Vegetables: Artichokes. Beets. Broccoli. Cabbage. Carrots. Eggplant. Green beans. Chard. Kale. Spinach. Onions. Leeks. Peas. Squash.  Tomatoes. Peppers. Radishes. ? Fruits: Apples. Apricots. Avocado. Berries. Bananas. Cherries. Dates. Figs. Grapes. Lemons. Melon. Oranges. Peaches. Plums. Pomegranate. ? Meats and other protein foods: Beans. Almonds. Sunflower seeds. Pine nuts. Peanuts. Cod. Salmon. Scallops. Shrimp. Tuna. Tilapia. Clams. Oysters. Eggs. ? Dairy: Low-fat milk. Cheese. Greek yogurt. ? Beverages: Water. Red wine. Herbal tea. ? Fats and oils: Extra virgin olive oil. Avocado oil. Grape seed oil. ? Sweets and desserts: [place] yogurt with honey. Baked apples. Poached pears. Trail mix. ? Seasoning and other foods: Basil. Cilantro. Coriander. Cumin. Mint. Parsley. Sage. Rosemary. Tarragon. Garlic. Oregano. Thyme. Pepper. Balsalmic vinegar. Tahini. Hummus. Tomato sauce. Olives. Mushrooms. ? Limit these ? Grains: Prepackaged pasta or rice dishes. Prepackaged cereal with added sugar. ? Vegetables: Deep fried potatoes (french fries). ? Fruits: Fruit canned in syrup. ? Meats and other protein foods: Beef. Pork. Lamb. Poultry with skin. Hot dogs. Tomasa Blase. ?  ? Dairy: Ice cream. Sour cream. Whole milk. ? Beverages: Juice. Sugar-sweetened soft drinks. Beer. Liquor and spirits. ? Fats and oils: Butter. Canola oil. Vegetable oil. Beef fat (tallow). Lard. ? Sweets and desserts: Cookies. Cakes. Pies. Candy. ? Seasoning and other foods: Mayonnaise. Premade sauces and marinades. ? The items listed may not be a complete list. Talk with your dietitian about what dietary choices are right for you. Summary  The Mediterranean diet includes both food and lifestyle choices.  Eat a variety of fresh fruits and vegetables, beans, nuts, seeds, and whole grains.  Limit the amount of red meat and sweets that you eat.  Talk with your health care provider about whether it is safe for you to drink red wine in moderation. This means 1 glass a day for nonpregnant women and 2 glasses a day for men. A glass of wine equals 5 oz (150 mL). This  information is not intended to replace advice given to you by your health care provider. Make sure you discuss any questions you have with your health care provider. Document Released: 08/31/2015 Document Revised: 10/03/2015 Document Reviewed: 08/31/2015 Elsevier Interactive Patient Education  2018 [employer].   Exercising to [employer] Exercising can help you to lose weight. In order to lose weight through exercise, you need to do vigorous-intensity exercise. You can tell that you are exercising with vigorous intensity if you are breathing very hard and fast and cannot hold a conversation while exercising. Moderate-intensity exercise helps to maintain your current weight. You can tell that you are exercising at a moderate level if you have a higher heart rate and faster breathing, but you are still able to hold a conversation. How often should I exercise? Choose an activity that you enjoy and set realistic goals. Your health care provider can help you to make an activity plan that works for you. Exercise regularly as directed by your health care provider. This may include:  Doing resistance training twice each week, such as: ? Push-ups. ? Sit-ups. ? Lifting weights. ? Using resistance bands.  Doing a given intensity of exercise for a given amount of time. Choose from these options: ?  150 minutes of moderate-intensity exercise every week. ? 75 minutes of vigorous-intensity exercise every week. ? A mix of moderate-intensity and vigorous-intensity exercise every week.  Children, pregnant women, people who are out of shape, people who are overweight, and older adults may need to consult a health care provider for individual recommendations. If you have any sort of medical condition, be sure to consult your health care provider before starting a new exercise program. What are some activities that can help me to lose weight?  Walking at a rate of at least 4.5 miles an hour.  Jogging or  running at a rate of 5 miles per hour.  Biking at a rate of at least 10 miles per hour.  Lap swimming.  Roller-skating or in-line skating.  Cross-country skiing.  Vigorous competitive sports, such as football, basketball, and soccer.  Jumping rope.  Aerobic dancing. How can I be more active in my day-to-day activities?  Use the stairs instead of the elevator.  Take a walk during your lunch break.  If you drive, park your car farther away from work or school.  If you take public transportation, get off one stop early and walk the rest of the way.  Make all of your phone calls while standing up and walking around.  Get up, stretch, and walk around every 30 minutes throughout the day. What guidelines should I follow while exercising?  Do not exercise so much that you hurt yourself, feel dizzy, or get very short of breath.  Consult your health care provider prior to starting a new exercise program.  Wear comfortable clothes and shoes with good support.  Drink plenty of water while you exercise to prevent dehydration or heat stroke. Body water is lost during exercise and must be replaced.  Work out until you breathe faster and your heart beats faster. This information is not intended to replace advice given to you by your health care provider. Make sure you discuss any questions you have with your health care provider. Document Released: 02/09/2010 Document Revised: 06/15/2015 Document Reviewed: 06/10/2013 Elsevier Interactive Patient Education  2018 [employer]   GREAT, GREAT, GREAT JOB ON THE WEIGHT LOSS! Please continue healthy eating and regular movement. Two more months of phentermine will be provided to complete course of medication. Would like to transition you to once daily Saxenda to help you continue with weight loss. Please call clinic in 6 weeks and we will send in Saxenda rx. Follow-up in 3 months! SO PROUD OF YOU!!!

## 2017-07-07 NOTE — Progress Notes (Signed)
Subjective:    Patient ID: Sandy Christensen, female    DOB: Jun 27, 1985, 32 y.o.   MRN: [id number]  HPI:  Sandy Christensen is here for f/u: medical wt loss.  She was on Phentermine June- Nov 2018, then two months of medication vacation. She was re-started on Phentermine 37.5mg  QD Feb 2019, and she lost >15 lbs, for a total of 60 lb wt loss since June 2018! Final two months of phentermine will be provided and then would like to transition her to injectable Saxenda She estimates to drink >100 oz water/day and she follows heart healthy diet. She recently separated from her wife and she will begin therapy next month. She reports an excellent support system of family/friends and is hopeful for a new future.  Patient Care Team    Relationship Specialty Notifications Start End  Julaine Fusi, NP PCP - General Family Medicine  04/24/16     Patient Active Problem List   Diagnosis Date Noted  . Grief 02/24/2017  . Breast pain 06/27/2016  . Acute carpal tunnel syndrome of right wrist 06/27/2016  . Morbid obesity (HCC) 04/24/2016  . Back pain 04/24/2016  . Epidermal cyst of ear 04/24/2016  . Health care maintenance 04/24/2016  . Closed nondisplaced fracture of middle phalanx of left ring finger with routine healing 08/02/2015     No past medical history on file.   No past surgical history on file.   Family History  Problem Relation Age of Onset  . Hypertension Mother   . Cancer Father        skin  . Healthy Sister   . Healthy Brother   . Healthy Sister   . Healthy Sister   . Healthy Brother      Social History   Substance and Sexual Activity  Drug Use No     Social History   Substance and Sexual Activity  Alcohol Use Yes   Comment: Once a month     Social History   Tobacco Use  Smoking Status Former Smoker  . Packs/day: 0.50  . Years: 2.00  . Pack years: 1.00  . Types: Cigarettes  . Last attempt to quit: 01/21/1998  . Years since quitting: 19.4  Smokeless  Tobacco Never Used     Outpatient Encounter Medications as of 07/07/2017  Medication Sig  . Ascorbic Acid (VITAMIN C PO) Take 1 tablet by mouth daily.  Marland Kitchen aspirin-acetaminophen-caffeine (EXCEDRIN MIGRAINE) 250-250-65 MG tablet Take 2 tablets by mouth every 6 (six) hours as needed for headache.  . Biotin w/ Vitamins C & E (HAIR/SKIN/NAILS PO) Take 1 tablet by mouth daily.   . cetirizine (ZYRTEC) 10 MG tablet Take 10 mg by mouth daily.  . cholecalciferol (VITAMIN D) 1000 units tablet Take 2,000 Units by mouth daily.  Marland Kitchen esomeprazole (NEXIUM) 20 MG capsule Take 20 mg by mouth 2 (two) times daily before a meal.  . fluticasone (FLONASE) 50 MCG/ACT nasal spray Place 1 spray into both nostrils daily as needed for allergies or rhinitis.  . Multiple Vitamins-Minerals (ECHINACEA ACZ PO) Take 1 tablet by mouth daily.  . phentermine (ADIPEX-P) 37.5 MG tablet TAKE 1 TABLET BY MOUTH ONCE DAILY BEFORE BREAKFAST  . RaNITidine HCl (ZANTAC PO) Take 1 tablet by mouth daily as needed (indigestion.).  . [DISCONTINUED] phentermine (ADIPEX-P) 37.5 MG tablet TAKE 1 TABLET BY MOUTH ONCE DAILY BEFORE BREAKFAST   No facility-administered encounter medications on file as of 07/07/2017.     Allergies: Latex and Neosporin [neomycin-bacitracin zn-polymyx]  Body mass index is 50.69 kg/m.  Blood pressure 122/75, pulse 83, height 5' 7.76" (1.721 m), weight (!) 331 lb (150.1 kg), SpO2 100 %.  Review of Systems  Constitutional: Negative for activity change, appetite change, chills, diaphoresis, fatigue, fever and unexpected weight change.  Respiratory: Negative for cough, chest tightness, shortness of breath, wheezing and stridor.   Cardiovascular: Negative for chest pain, palpitations and leg swelling.  Neurological: Negative for dizziness and headaches.  Hematological: Does not bruise/bleed easily.  Psychiatric/Behavioral: Negative for sleep disturbance.       Objective:   Physical Exam  Constitutional: She  appears well-developed and well-nourished. No distress.  HENT:  Head: Normocephalic and atraumatic.  Right Ear: External ear normal.  Left Ear: External ear normal.  Cardiovascular: Normal rate, regular rhythm, normal heart sounds and intact distal pulses.  No murmur heard. Pulmonary/Chest: Effort normal and breath sounds normal. No stridor. No respiratory distress. She has no wheezes. She has no rales. She exhibits no tenderness.  Skin: Skin is warm and dry. Capillary refill takes less than 2 seconds. No rash noted. She is not diaphoretic. No erythema. No pallor.  Psychiatric: She has a normal mood and affect. Her behavior is normal. Judgment and thought content normal.  Nursing note and vitals reviewed.     Assessment & Plan:   1. Morbid obesity (HCC)     Morbid obesity (HCC) Body mass index is 50.69 kg/m. Current wt 331 She has lost 69 lbs since June 2018 [facility]  Controlled Substance Database reviewed-no aberrancies noted. Two final months of phentermine 37.5mg  QAM provided Would like to transition her to once daily injectable Saxenda in 2 months Please call clinic in 6 weeks and we will send in Saxenda 3 month f/u   FOLLOW-UP:  Return in about 3 months (around 10/07/2017) for Regular Follow Up.

## 2017-08-15 ENCOUNTER — Other Ambulatory Visit: Payer: Self-pay | Admitting: Obstetrics and Gynecology

## 2017-08-15 ENCOUNTER — Other Ambulatory Visit (HOSPITAL_COMMUNITY)
Admission: RE | Admit: 2017-08-15 | Discharge: 2017-08-15 | Disposition: A | Discharge status: Home / Self Care | Payer: Managed Care, Other (non HMO) | Source: Ambulatory Visit | Attending: Obstetrics and Gynecology | Admitting: Obstetrics and Gynecology

## 2017-08-15 DIAGNOSIS — Z01411 Encounter for gynecological examination (general) (routine) with abnormal findings: Secondary | ICD-10-CM | POA: Insufficient documentation

## 2017-08-19 LAB — CERVICOVAGINAL ANCILLARY ONLY: HPV: NOT DETECTED

## 2017-09-08 ENCOUNTER — Other Ambulatory Visit: Payer: Managed Care, Other (non HMO)

## 2017-09-10 ENCOUNTER — Other Ambulatory Visit: Payer: Managed Care, Other (non HMO)

## 2017-10-31 NOTE — Progress Notes (Signed)
Subjective:    Patient ID: Sandy Christensen, female    DOB: 06/08/1985, 32 y.o.   MRN: [id number]  HPI: 07/07/17 OV:  Sandy Christensen is here for f/u: medical wt loss.  She was on Phentermine June- Nov 2018, then two months of medication vacation. She was re-started on Phentermine 37.5mg  QD Feb 2019, and she lost >15 lbs, for a total of 60 lb wt loss since June 2018! Final two months of phentermine will be provided and then would like to transition her to injectable Saxenda She estimates to drink >100 oz water/day and she follows heart healthy diet. She recently separated from her wife and she will begin therapy next month. She reports an excellent support system of family/friends and is hopeful for a new future.   11/03/17 OV: Sandy Christensen is her for f/u: Medical wt loss. 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 Healthcare maintenance 11/03/2017  . BMI 50.0-59.9, adult (HCC) 11/03/2017  . Grief 02/24/2017  . Breast pain 06/27/2016  . Acute carpal tunnel syndrome of right wrist 06/27/2016  . Morbid obesity (HCC) 04/24/2016  . Back pain 04/24/2016  . Epidermal cyst of ear 04/24/2016  . Health care maintenance 04/24/2016  . Closed nondisplaced fracture of middle phalanx of left ring  finger with routine healing 08/02/2015     History reviewed. No pertinent past medical history.   History reviewed. No pertinent surgical history.   Family History  Problem Relation Age of Onset  . Hypertension Mother   . Cancer Father        skin  . Healthy Sister   . Healthy Brother   . Healthy Sister   . Healthy Sister   . Healthy Brother      Social History   Substance and Sexual Activity  Drug Use No     Social History   Substance and Sexual Activity  Alcohol Use Yes   Comment: Once a month     Social History   Tobacco Use  Smoking Status Former Smoker  . Packs/day: 0.50  . Years: 2.00  . Pack years: 1.00  . Types: Cigarettes  . Last attempt to quit: 01/21/1998  . Years since quitting: 19.7  Smokeless Tobacco Never Used     Outpatient Encounter Medications as of 11/03/2017  Medication Sig  . Ascorbic Acid (VITAMIN C PO) Take 1 tablet by mouth daily.  Marland Kitchen aspirin-acetaminophen-caffeine (EXCEDRIN MIGRAINE) 250-250-65 MG tablet Take 2 tablets by mouth every 6 (six) hours as needed for headache.  . Biotin w/ Vitamins C & E (HAIR/SKIN/NAILS PO) Take 1 tablet by mouth daily.   . cetirizine (ZYRTEC) 10 MG tablet Take 10 mg by mouth daily.  . cholecalciferol (VITAMIN D) 1000 units tablet Take 2,000 Units by mouth daily.  Marland Kitchen  esomeprazole (NEXIUM) 20 MG capsule Take 20 mg by mouth 2 (two) times daily before a meal.  . fluticasone (FLONASE) 50 MCG/ACT nasal spray Place 1 spray into both nostrils daily as needed for allergies or rhinitis.  . Multiple Vitamins-Minerals (ECHINACEA ACZ PO) Take 1 tablet by mouth daily.  . RaNITidine HCl (ZANTAC PO) Take 1 tablet by mouth daily as needed (indigestion.).  Marland Kitchen phentermine (ADIPEX-P) 37.5 MG tablet TAKE 1 TABLET BY MOUTH ONCE DAILY BEFORE BREAKFAST  . [DISCONTINUED] phentermine (ADIPEX-P) 37.5 MG tablet TAKE 1 TABLET BY MOUTH ONCE DAILY BEFORE BREAKFAST  . [DISCONTINUED] phentermine (ADIPEX-P) 37.5 MG tablet TAKE 1 TABLET  BY MOUTH ONCE DAILY BEFORE BREAKFAST   No facility-administered encounter medications on file as of 11/03/2017.     Allergies: Latex and Neosporin [neomycin-bacitracin zn-polymyx]  Body mass index is 51.19 kg/m.  Blood pressure 112/71, pulse 67, height 5' 7.75" (1.721 m), weight (!) 334 lb 3.2 oz (151.6 kg), last menstrual period 11/02/2017, SpO2 98 %.  Review of Systems  Constitutional: Negative for activity change, appetite change, chills, diaphoresis, fatigue, fever and unexpected weight change.  Respiratory: Negative for cough, chest tightness, shortness of breath, wheezing and stridor.   Cardiovascular: Negative for chest pain, palpitations and leg swelling.  Neurological: Negative for dizziness and headaches.  Hematological: Does not bruise/bleed easily.  Psychiatric/Behavioral: Negative for sleep disturbance.       Objective:   Physical Exam  Constitutional: She appears well-developed and well-nourished. No distress.  HENT:  Head: Normocephalic and atraumatic.  Right Ear: External ear normal.  Left Ear: External ear normal.  Cardiovascular: Normal rate, regular rhythm, normal heart sounds and intact distal pulses.  No murmur heard. Pulmonary/Chest: Effort normal and breath sounds normal. No stridor. No respiratory distress. She has no wheezes. She has no rales. She exhibits no tenderness.  Skin: Skin is warm and dry. Capillary refill takes less than 2 seconds. No rash noted. She is not diaphoretic. No erythema. No pallor.  Psychiatric: She has a normal mood and affect. Her behavior is normal. Judgment and thought content normal.  Nursing note and vitals reviewed.     Assessment & Plan:   1. Need for influenza vaccination   2. Healthcare maintenance   3. BMI 50.0-59.9, adult [facility])     Healthcare maintenance Fasting labs obtained today, will call when results are available. Please schedule office visit in 3 months to evaluate need to continue medication.  BMI  50.0-59.9, adult (HCC) Body mass index is 51.19 kg/m.  Current wt 334 Goal <200 [place] Controlled Substance Database reviewed- no aberrancies noted Since Saxenda is not financially an option, and you have tolerated Phentermine well, will re-start you on medication for another 3 month course. When you need refill, please contact your pharmacy. Please send MyChart message in every 4 weeks and let me know how your diet, exercise program,weight, and blood pressure is overall running. Please schedule office visit in 3 months to evaluate need to continue medication.  FOLLOW-UP:  Return in about 3 months (around 02/03/2018) for Regular Follow Up, Evaluate Medication Effectiveness, Medical Weight Loss.

## 2017-11-03 ENCOUNTER — Ambulatory Visit (INDEPENDENT_AMBULATORY_CARE_PROVIDER_SITE_OTHER): Payer: Managed Care, Other (non HMO) | Admitting: Adult Health

## 2017-11-03 ENCOUNTER — Encounter: Payer: Self-pay | Admitting: Adult Health

## 2017-11-03 VITALS — BP 112/71 | HR 67 | Ht 67.75 in | Wt 334.2 lb

## 2017-11-03 DIAGNOSIS — Z23 Encounter for immunization: Secondary | ICD-10-CM | POA: Diagnosis not present

## 2017-11-03 DIAGNOSIS — Z0000 Encounter for general adult medical examination without abnormal findings: Secondary | ICD-10-CM | POA: Diagnosis not present

## 2017-11-03 DIAGNOSIS — Z6841 Body Mass Index (BMI) 40.0 and over, adult: Secondary | ICD-10-CM | POA: Insufficient documentation

## 2017-11-03 MED ORDER — PHENTERMINE HCL 37.5 MG PO TABS: ORAL_TABLET | ORAL | 0 refills | Status: DC

## 2017-11-03 NOTE — Assessment & Plan Note (Addendum)
Fasting labs obtained today, will call when results are available. Please schedule office visit in 3 months to evaluate need to continue medication.

## 2017-11-03 NOTE — Assessment & Plan Note (Signed)
Body mass index is 51.19 kg/m.  Current wt 334 Goal <200 [place] Controlled Substance Database reviewed- no aberrancies noted Since Saxenda is not financially an option, and you have tolerated Phentermine well, will re-start you on medication for another 3 month course. When you need refill, please contact your pharmacy. Please send MyChart message in every 4 weeks and let me know how your diet, exercise program,weight, and blood pressure is overall running. Please schedule office visit in 3 months to evaluate need to continue medication.

## 2017-11-03 NOTE — Patient Instructions (Signed)
Mediterranean Diet A Mediterranean diet refers to food and lifestyle choices that are based on the traditions of countries located on the [place] Sea. This way of eating has been shown to help prevent certain conditions and improve outcomes for people who have chronic diseases, like kidney disease and heart disease. What are tips for following this plan? Lifestyle  Cook and eat meals together with your family, when possible.  Drink enough fluid to keep your urine clear or pale yellow.  Be physically active every day. This includes: ? Aerobic exercise like running or swimming. ? Leisure activities like gardening, walking, or housework.  Get 7-8 hours of sleep each night.  If recommended by your health care provider, drink red wine in moderation. This means 1 glass a day for nonpregnant women and 2 glasses a day for men. A glass of wine equals 5 oz (150 mL). Reading food labels  Check the serving size of packaged foods. For foods such as rice and pasta, the serving size refers to the amount of cooked product, not dry.  Check the total fat in packaged foods. Avoid foods that have saturated fat or trans fats.  Check the ingredients list for added sugars, such as corn syrup. Shopping  At the grocery store, buy most of your food from the areas near the walls of the store. This includes: ? Fresh fruits and vegetables (produce). ? Grains, beans, nuts, and seeds. Some of these may be available in unpackaged forms or large amounts (in bulk). ? Fresh seafood. ? Poultry and eggs. ? Low-fat dairy products.  Buy whole ingredients instead of prepackaged foods.  Buy fresh fruits and vegetables in-season from local farmers markets.  Buy frozen fruits and vegetables in resealable bags.  If you do not have access to quality fresh seafood, buy precooked frozen shrimp or canned fish, such as tuna, salmon, or sardines.  Buy small amounts of raw or cooked vegetables, salads, or olives from the  deli or salad bar at your store.  Stock your pantry so you always have certain foods on hand, such as olive oil, canned tuna, canned tomatoes, rice, pasta, and beans. Cooking  Cook foods with extra-virgin olive oil instead of using butter or other vegetable oils.  Have meat as a side dish, and have vegetables or grains as your main dish. This means having meat in small portions or adding small amounts of meat to foods like pasta or stew.  Use beans or vegetables instead of meat in common dishes like chili or lasagna.  Experiment with different cooking methods. Try roasting or broiling vegetables instead of steaming or sauteing them.  Add frozen vegetables to soups, stews, pasta, or rice.  Add nuts or seeds for added healthy fat at each meal. You can add these to yogurt, salads, or vegetable dishes.  Marinate fish or vegetables using olive oil, lemon juice, garlic, and fresh herbs. Meal planning  Plan to eat 1 vegetarian meal one day each week. Try to work up to 2 vegetarian meals, if possible.  Eat seafood 2 or more times a week.  Have healthy snacks readily available, such as: ? Vegetable sticks with hummus. ? Greek yogurt. ? Fruit and nut trail mix.  Eat balanced meals throughout the week. This includes: ? Fruit: 2-3 servings a day ? Vegetables: 4-5 servings a day ? Low-fat dairy: 2 servings a day ? Fish, poultry, or lean meat: 1 serving a day ? Beans and legumes: 2 or more servings a week ? Nuts   and seeds: 1-2 servings a day ? Whole grains: 6-8 servings a day ? Extra-virgin olive oil: 3-4 servings a day  Limit red meat and sweets to only a few servings a month What are my food choices?  Mediterranean diet ? Recommended ? Grains: Whole-grain pasta. Brown rice. Bulgar wheat. Polenta. Couscous. Whole-wheat bread. [place]. ? Vegetables: Artichokes. Beets. Broccoli. Cabbage. Carrots. Eggplant. Green beans. Chard. Kale. Spinach. Onions. Leeks. Peas. Squash.  Tomatoes. Peppers. Radishes. ? Fruits: Apples. Apricots. Avocado. Berries. Bananas. Cherries. Dates. Figs. Grapes. Lemons. Melon. Oranges. Peaches. Plums. Pomegranate. ? Meats and other protein foods: Beans. Almonds. Sunflower seeds. Pine nuts. Peanuts. [place]. Salmon. Scallops. Shrimp. [place]. Tilapia. Clams. Oysters. Eggs. ? Dairy: Low-fat milk. Cheese. Greek yogurt. ? Beverages: Water. Red wine. Herbal tea. ? Fats and oils: Extra virgin olive oil. Avocado oil. Grape seed oil. ? Sweets and desserts: [place] yogurt with honey. Baked apples. Poached pears. Trail mix. ? Seasoning and other foods: Basil. Cilantro. Coriander. Cumin. Mint. Parsley. Sage. Rosemary. Tarragon. Garlic. Oregano. Thyme. Pepper. Balsalmic vinegar. Tahini. Hummus. Tomato sauce. Olives. Mushrooms. ? Limit these ? Grains: Prepackaged pasta or rice dishes. Prepackaged cereal with added sugar. ? Vegetables: Deep fried potatoes (french fries). ? Fruits: Fruit canned in syrup. ? Meats and other protein foods: Beef. Pork. Lamb. Poultry with skin. Hot dogs. Berniece Salines. ? Dairy: Ice cream. Sour cream. Whole milk. ? Beverages: Juice. Sugar-sweetened soft drinks. Beer. Liquor and spirits. ? Fats and oils: Butter. Canola oil. Vegetable oil. Beef fat (tallow). Lard. ? Sweets and desserts: Cookies. Cakes. Pies. Candy. ? Seasoning and other foods: Mayonnaise. Premade sauces and marinades. ? The items listed may not be a complete list. Talk with your dietitian about what dietary choices are right for you. Summary  The Mediterranean diet includes both food and lifestyle choices.  Eat a variety of fresh fruits and vegetables, beans, nuts, seeds, and whole grains.  Limit the amount of red meat and sweets that you eat.  Talk with your health care provider about whether it is safe for you to drink red wine in moderation. This means 1 glass a day for nonpregnant women and 2 glasses a day for men. A glass of wine equals 5 oz (150 mL). This information  is not intended to replace advice given to you by your health care provider. Make sure you discuss any questions you have with your health care provider. Document Released: 08/31/2015 Document Revised: 10/03/2015 Document Reviewed: 08/31/2015 Elsevier Interactive Patient Education  2018 [employer].   Exercising to [employer] Exercising can help you to lose weight. In order to lose weight through exercise, you need to do vigorous-intensity exercise. You can tell that you are exercising with vigorous intensity if you are breathing very hard and fast and cannot hold a conversation while exercising. Moderate-intensity exercise helps to maintain your current weight. You can tell that you are exercising at a moderate level if you have a higher heart rate and faster breathing, but you are still able to hold a conversation. How often should I exercise? Choose an activity that you enjoy and set realistic goals. Your health care provider can help you to make an activity plan that works for you. Exercise regularly as directed by your health care provider. This may include:  Doing resistance training twice each week, such as: ? Push-ups. ? Sit-ups. ? Lifting weights. ? Using resistance bands.  Doing a given intensity of exercise for a given amount of time. Choose from these options: ? 150  minutes of moderate-intensity exercise every week. ? 75 minutes of vigorous-intensity exercise every week. ? A mix of moderate-intensity and vigorous-intensity exercise every week.  Children, pregnant women, people who are out of shape, people who are overweight, and older adults may need to consult a health care provider for individual recommendations. If you have any sort of medical condition, be sure to consult your health care provider before starting a new exercise program. What are some activities that can help me to lose weight?  Walking at a rate of at least 4.5 miles an hour.  Jogging or running at a  rate of 5 miles per hour.  Biking at a rate of at least 10 miles per hour.  Lap swimming.  Roller-skating or in-line skating.  Cross-country skiing.  Vigorous competitive sports, such as football, basketball, and soccer.  Jumping rope.  Aerobic dancing. How can I be more active in my day-to-day activities?  Use the stairs instead of the elevator.  Take a walk during your lunch break.  If you drive, park your car farther away from work or school.  If you take public transportation, get off one stop early and walk the rest of the way.  Make all of your phone calls while standing up and walking around.  Get up, stretch, and walk around every 30 minutes throughout the day. What guidelines should I follow while exercising?  Do not exercise so much that you hurt yourself, feel dizzy, or get very short of breath.  Consult your health care provider prior to starting a new exercise program.  Wear comfortable clothes and shoes with good support.  Drink plenty of water while you exercise to prevent dehydration or heat stroke. Body water is lost during exercise and must be replaced.  Work out until you breathe faster and your heart beats faster. This information is not intended to replace advice given to you by your health care provider. Make sure you discuss any questions you have with your health care provider. Document Released: 02/09/2010 Document Revised: 06/15/2015 Document Reviewed: 06/10/2013 Elsevier Interactive Patient Education  [employer].  Since [place] is not financially an option, and you have tolerated Phentermine well, will re-start you on medication for another 3 month course. When you need refill, please contact your pharmacy. Please send MyChart message in every 4 weeks and let me know how your diet, exercise program,weight, and blood pressure is overall running. Please schedule office visit in 3 months to evaluate need to continue medication. NICE TO  SEE YOU!

## 2017-11-04 LAB — COMPREHENSIVE METABOLIC PANEL
ALK PHOS: 73 IU/L (ref 39–117)
ALT: 15 IU/L (ref 0–32)
AST: 12 IU/L (ref 0–40)
Albumin/Globulin Ratio: 1.7 (ref 1.2–2.2)
Albumin: 4.1 g/dL (ref 3.5–5.5)
BILIRUBIN TOTAL: 0.4 mg/dL (ref 0.0–1.2)
BUN / CREAT RATIO: 13 (ref 9–23)
BUN: 10 mg/dL (ref 6–20)
CO2: 23 mmol/L (ref 20–29)
CREATININE: 0.79 mg/dL (ref 0.57–1.00)
Calcium: 9 mg/dL (ref 8.7–10.2)
Chloride: 105 mmol/L (ref 96–106)
GFR calc Af Amer: 115 mL/min/{1.73_m2} (ref >59)
GFR calc non Af Amer: 100 mL/min/{1.73_m2} (ref >59)
Globulin, Total: 2.4 g/dL (ref 1.5–4.5)
Glucose: 96 mg/dL (ref 65–99)
Potassium: 4.3 mmol/L (ref 3.5–5.2)
Sodium: 142 mmol/L (ref 134–144)
TOTAL PROTEIN: 6.5 g/dL (ref 6.0–8.5)

## 2017-11-04 LAB — CBC WITH DIFFERENTIAL/PLATELET
Basophils Absolute: 0 10*3/uL (ref 0.0–0.2)
Basos: 1 %
EOS (ABSOLUTE): 0.1 10*3/uL (ref 0.0–0.4)
Eos: 1 %
Hematocrit: 38.7 % (ref 34.0–46.6)
Hemoglobin: 12.9 g/dL (ref 11.1–15.9)
IMMATURE GRANS (ABS): 0 10*3/uL (ref 0.0–0.1)
IMMATURE GRANULOCYTES: 0 %
LYMPHS: 29 %
Lymphocytes Absolute: 2.2 10*3/uL (ref 0.7–3.1)
MCH: 29.2 pg (ref 26.6–33.0)
MCHC: 33.3 g/dL (ref 31.5–35.7)
MCV: 88 fL (ref 79–97)
MONOCYTES: 7 %
Monocytes Absolute: 0.6 10*3/uL (ref 0.1–0.9)
NEUTROS ABS: 4.9 10*3/uL (ref 1.4–7.0)
Neutrophils: 62 %
Platelets: 280 10*3/uL (ref 150–450)
RBC: 4.42 x10E6/uL (ref 3.77–5.28)
RDW: 12.9 % (ref 12.3–15.4)
WBC: 7.9 10*3/uL (ref 3.4–10.8)

## 2017-11-04 LAB — LIPID PANEL
CHOLESTEROL TOTAL: 165 mg/dL (ref 100–199)
Chol/HDL Ratio: 3.8 ratio (ref 0.0–4.4)
HDL: 43 mg/dL (ref >39)
LDL Calculated: 111 mg/dL — ABNORMAL HIGH (ref 0–99)
Triglycerides: 57 mg/dL (ref 0–149)
VLDL Cholesterol Cal: 11 mg/dL (ref 5–40)

## 2017-11-04 LAB — TSH: TSH: 1.16 u[IU]/mL (ref 0.450–4.500)

## 2017-11-04 LAB — HEMOGLOBIN A1C
Est. average glucose Bld gHb Est-mCnc: 105 mg/dL
HEMOGLOBIN A1C: 5.3 % (ref 4.8–5.6)

## 2017-12-03 ENCOUNTER — Other Ambulatory Visit: Payer: Self-pay | Admitting: Adult Health

## 2017-12-03 NOTE — Telephone Encounter (Signed)
Pt is not due for follow up until 01/2018.  Sandy Amass. Christensen, CMA

## 2017-12-03 NOTE — Telephone Encounter (Signed)
[place] Controlled Substance Database reviewed- checked/ok Will send tomorrow Thanks! Orpha BurKaty

## 2018-01-12 ENCOUNTER — Encounter: Payer: Self-pay | Admitting: Adult Health

## 2018-01-12 ENCOUNTER — Other Ambulatory Visit: Payer: Self-pay | Admitting: Adult Health

## 2018-01-12 MED ORDER — PHENTERMINE HCL 37.5 MG PO TABS: 37.5000 mg | ORAL_TABLET | Freq: Every day | ORAL | 0 refills | Status: DC

## 2018-01-12 NOTE — Progress Notes (Signed)
Final month of phentermine sent in

## 2018-01-12 NOTE — Telephone Encounter (Signed)
[place] Controlled Substance report placed in your office.  Please review for appropriateness of refill.  Tiajuana Amass. Nelson, CMA

## 2018-02-03 NOTE — Progress Notes (Signed)
Subjective:    Patient ID: Sandy Christensen, female    DOB: [birth year], 33 y.o.   MRN: [id number]  HPI: 07/07/17 OV:  Sandy Christensen is here for f/u: medical wt loss.  She was on Phentermine June- Nov 2018, then two months of medication vacation. She was re-started on Phentermine 37.5mg  QD Feb 2019, and she lost >15 lbs, for a total of 60 lb wt loss since June 2018! Final two months of phentermine will be provided and then would like to transition her to injectable Saxenda She estimates to drink >100 oz water/day and she follows heart healthy diet. She recently separated from her wife and she will begin therapy next month. She reports an excellent support system of family/friends and is hopeful for a new future.   11/03/17 OV: Sandy Christensen is her for f/u: Medical wt loss. 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) Current wt 334 Goal <200 She continue to abstain from tobacco/ETOH She denies CP/dyspnea/dizziness/HA/palpitations Fasting labs obtained today  02/04/2018 OV: Sandy Christensen presents for medical weight loss Starting weight 408, current wt 333 Has been on phentermine 12 months and lost 75lbs Has weaned off Rx and needs medical support to reach goal wt of 250- will [facility] She has been following diet- whole grains, lean protein, low sugar/CHO Drinks >100 oz water/day Denies tobacco/vape/regular ETOH use She walks mile/day and youtube exercise videos BID  Patient Care Team    Relationship Specialty Notifications Start End  , Jinny Blossom, NP PCP - General Family Medicine   04/24/16     Patient Active Problem List   Diagnosis Date Noted  . Healthcare maintenance 11/03/2017  . BMI 50.0-59.9, adult (HCC) 11/03/2017  . Grief 02/24/2017  . Breast pain 06/27/2016  . Acute carpal tunnel syndrome of right wrist 06/27/2016  . Morbid obesity (HCC) 04/24/2016  . Back pain 04/24/2016  . Epidermal cyst of ear 04/24/2016  . Health care maintenance 04/24/2016  . Closed nondisplaced fracture of middle phalanx of left ring finger with routine healing 08/02/2015     History reviewed. No pertinent past medical history.   History reviewed. No pertinent surgical history.   Family History  Problem Relation Age of Onset  . Hypertension Mother   . Cancer Father        skin  . Healthy Sister   . Healthy Brother   . Healthy Sister   . Healthy Sister   . Healthy Brother      Social History   Substance and Sexual Activity  Drug Use No     Social History   Substance and Sexual Activity  Alcohol Use Yes   Comment: Once a month     Social History   Tobacco Use  Smoking Status Former Smoker  . Packs/day: 0.50  . Years: 2.00  . Pack years: 1.00  . Types: Cigarettes  . Last attempt to quit: 01/21/1998  . Years since quitting: 20.0  Smokeless Tobacco Never Used     Outpatient Encounter Medications as of 02/04/2018  Medication Sig  . Ascorbic Acid (  VITAMIN C PO) Take 1 tablet by mouth daily.  Marland Kitchen aspirin-acetaminophen-caffeine (EXCEDRIN MIGRAINE) 250-250-65 MG tablet Take 2 tablets by mouth every 6 (six) hours as needed for headache.  . Biotin w/ Vitamins C & E (HAIR/SKIN/NAILS PO) Take 1 tablet by mouth daily.   . cetirizine (ZYRTEC) 10 MG tablet Take 10 mg by mouth daily.  . cholecalciferol (VITAMIN D) 1000 units tablet Take 2,000 Units by mouth daily.  . Multiple Vitamins-Minerals (ECHINACEA ACZ PO) Take 1 tablet by mouth daily.  . phentermine (ADIPEX-P) 37.5 MG tablet Take 1 tablet (37.5 mg total) by mouth daily before breakfast.  . RaNITidine HCl  (ZANTAC PO) Take 1 tablet by mouth daily as needed (indigestion.).  . [DISCONTINUED] esomeprazole (NEXIUM) 20 MG capsule Take 20 mg by mouth 2 (two) times daily before a meal.  . [DISCONTINUED] fluticasone (FLONASE) 50 MCG/ACT nasal spray Place 1 spray into both nostrils daily as needed for allergies or rhinitis.   No facility-administered encounter medications on file as of 02/04/2018.     Allergies: Latex and Neosporin [neomycin-bacitracin zn-polymyx]  Body mass index is 51.13 kg/m.  Blood pressure 110/73, pulse 68, temperature 98.6 F (37 C), temperature source Oral, height 5' 7.75" (1.721 m), weight (!) 333 lb 12.8 oz (151.4 kg), last menstrual period 01/21/2018, SpO2 98 %.  Review of Systems  Constitutional: Negative for activity change, appetite change, chills, diaphoresis, fatigue, fever and unexpected weight change.  Respiratory: Negative for cough, chest tightness, shortness of breath, wheezing and stridor.   Cardiovascular: Negative for chest pain, palpitations and leg swelling.  Neurological: Negative for dizziness and headaches.  Hematological: Does not bruise/bleed easily.  Psychiatric/Behavioral: Negative for sleep disturbance.       Objective:   Physical Exam Vitals signs and nursing note reviewed.  Constitutional:      General: She is not in acute distress.    Appearance: She is well-developed. She is not diaphoretic.  HENT:     Head: Normocephalic and atraumatic.     Right Ear: External ear normal.     Left Ear: External ear normal.  Cardiovascular:     Rate and Rhythm: Normal rate and regular rhythm.     Heart sounds: Normal heart sounds. No murmur.  Pulmonary:     Effort: Pulmonary effort is normal. No respiratory distress.     Breath sounds: Normal breath sounds. No stridor. No wheezing or rales.  Chest:     Chest wall: No tenderness.  Skin:    General: Skin is warm and dry.     Capillary Refill: Capillary refill takes less than 2 seconds.      Coloration: Skin is not pale.     Findings: No erythema or rash.  Psychiatric:        Behavior: Behavior normal.        Thought Content: Thought content normal.        Judgment: Judgment normal.       Assessment & Plan:   1. Morbid obesity (HCC)   2. BMI 50.0-59.9, adult (HCC)     BMI 50.0-59.9, adult (HCC) Continue to eat healthy diet and remain as active as possible. Referral placed to [facility] to help you continue to reach your goal- YOU CAN DO IT! Recommend complete physical with fasting labs in 6 months. Starting wt 408, current wt 333, goal wt 250  Morbid obesity (HCC) Body mass index is 51.13 kg/m.  Starting wt 408, current wt 333, goal wt 250 Has been on  phentermine 12 months- now weaned off Referred to [facility] Continue Mediterranean Diet and Regular Exercise   FOLLOW-UP:  Return in about 6 months (around 08/05/2018) for Fasting Labs, CPE.

## 2018-02-04 ENCOUNTER — Encounter: Payer: Self-pay | Admitting: Adult Health

## 2018-02-04 ENCOUNTER — Ambulatory Visit (INDEPENDENT_AMBULATORY_CARE_PROVIDER_SITE_OTHER): Payer: Managed Care, Other (non HMO) | Admitting: Adult Health

## 2018-02-04 DIAGNOSIS — Z6841 Body Mass Index (BMI) 40.0 and over, adult: Secondary | ICD-10-CM | POA: Diagnosis not present

## 2018-02-04 NOTE — Assessment & Plan Note (Signed)
Continue to eat healthy diet and remain as active as possible. Referral placed to [facility] to help you continue to reach your goal- YOU CAN DO IT! Recommend complete physical with fasting labs in 6 months. Starting wt 408, current wt 333, goal wt 250

## 2018-02-04 NOTE — Patient Instructions (Signed)
Mediterranean Diet A Mediterranean diet refers to food and lifestyle choices that are based on the traditions of countries located on the [employer]. This way of eating has been shown to help prevent certain conditions and improve outcomes for people who have chronic diseases, like kidney disease and heart disease. What are tips for following this plan? Lifestyle  Cook and eat meals together with your family, when possible.  Drink enough fluid to keep your urine clear or pale yellow.  Be physically active every day. This includes: ? Aerobic exercise like running or swimming. ? Leisure activities like gardening, walking, or housework.  Get 7-8 hours of sleep each night.  If recommended by your health care provider, drink red wine in moderation. This means 1 glass a day for nonpregnant women and 2 glasses a day for men. A glass of wine equals 5 oz (150 mL). Reading food labels   Check the serving size of packaged foods. For foods such as rice and pasta, the serving size refers to the amount of cooked product, not dry.  Check the total fat in packaged foods. Avoid foods that have saturated fat or trans fats.  Check the ingredients list for added sugars, such as corn syrup. Shopping  At the grocery store, buy most of your food from the areas near the walls of the store. This includes: ? Fresh fruits and vegetables (produce). ? Grains, beans, nuts, and seeds. Some of these may be available in unpackaged forms or large amounts (in bulk). ? Fresh seafood. ? Poultry and eggs. ? Low-fat dairy products.  Buy whole ingredients instead of prepackaged foods.  Buy fresh fruits and vegetables in-season from local farmers markets.  Buy frozen fruits and vegetables in resealable bags.  If you do not have access to quality fresh seafood, buy precooked frozen shrimp or canned fish, such as tuna, salmon, or sardines.  Buy small amounts of raw or cooked vegetables, salads, or olives from  the deli or salad bar at your store.  Stock your pantry so you always have certain foods on hand, such as olive oil, canned tuna, canned tomatoes, rice, pasta, and beans. Cooking  Cook foods with extra-virgin olive oil instead of using butter or other vegetable oils.  Have meat as a side dish, and have vegetables or grains as your main dish. This means having meat in small portions or adding small amounts of meat to foods like pasta or stew.  Use beans or vegetables instead of meat in common dishes like chili or lasagna.  Experiment with different cooking methods. Try roasting or broiling vegetables instead of steaming or sauteing them.  Add frozen vegetables to soups, stews, pasta, or rice.  Add nuts or seeds for added healthy fat at each meal. You can add these to yogurt, salads, or vegetable dishes.  Marinate fish or vegetables using olive oil, lemon juice, garlic, and fresh herbs. Meal planning   Plan to eat 1 vegetarian meal one day each week. Try to work up to 2 vegetarian meals, if possible.  Eat seafood 2 or more times a week.  Have healthy snacks readily available, such as: ? Vegetable sticks with hummus. ? [place] yogurt. ? Fruit and nut trail mix.  Eat balanced meals throughout the week. This includes: ? Fruit: 2-3 servings a day ? Vegetables: 4-5 servings a day ? Low-fat dairy: 2 servings a day ? Fish, poultry, or lean meat: 1 serving a day ? Beans and legumes: 2 or more servings a week ?  Nuts and seeds: 1-2 servings a day ? Whole grains: 6-8 servings a day ? Extra-virgin olive oil: 3-4 servings a day  Limit red meat and sweets to only a few servings a month What are my food choices?  Mediterranean diet ? Recommended ? Grains: Whole-grain pasta. Brown rice. Bulgar wheat. Polenta. Couscous. Whole-wheat bread. Orpah Cobb. ? Vegetables: Artichokes. Beets. Broccoli. Cabbage. Carrots. Eggplant. Green beans. Chard. Kale. Spinach. Onions. Leeks. Peas. Squash.  Tomatoes. Peppers. Radishes. ? Fruits: Apples. Apricots. Avocado. Berries. Bananas. Cherries. Dates. Figs. Grapes. Lemons. Melon. Oranges. Peaches. Plums. Pomegranate. ? Meats and other protein foods: Beans. Almonds. Sunflower seeds. Pine nuts. Peanuts. Cod. Salmon. Scallops. Shrimp. Tuna. Tilapia. Clams. Oysters. Eggs. ? Dairy: Low-fat milk. Cheese. Greek yogurt. ? Beverages: Water. Red wine. Herbal tea. ? Fats and oils: Extra virgin olive oil. Avocado oil. Grape seed oil. ? Sweets and desserts: [place] yogurt with honey. Baked apples. Poached pears. Trail mix. ? Seasoning and other foods: Basil. Cilantro. Coriander. Cumin. Mint. Parsley. Sage. Rosemary. Tarragon. Garlic. Oregano. Thyme. Pepper. Balsalmic vinegar. Tahini. Hummus. Tomato sauce. Olives. Mushrooms. ? Limit these ? Grains: Prepackaged pasta or rice dishes. Prepackaged cereal with added sugar. ? Vegetables: Deep fried potatoes (french fries). ? Fruits: Fruit canned in syrup. ? Meats and other protein foods: Beef. Pork. Lamb. Poultry with skin. Hot dogs. Tomasa Blase. ? Dairy: Ice cream. Sour cream. Whole milk. ? Beverages: Juice. Sugar-sweetened soft drinks. Beer. Liquor and spirits. ? Fats and oils: Butter. Canola oil. Vegetable oil. Beef fat (tallow). Lard. ? Sweets and desserts: Cookies. Cakes. Pies. Candy. ? Seasoning and other foods: Mayonnaise. Premade sauces and marinades. ? The items listed may not be a complete list. Talk with your dietitian about what dietary choices are right for you. Summary  The Mediterranean diet includes both food and lifestyle choices.  Eat a variety of fresh fruits and vegetables, beans, nuts, seeds, and whole grains.  Limit the amount of red meat and sweets that you eat.  Talk with your health care provider about whether it is safe for you to drink red wine in moderation. This means 1 glass a day for nonpregnant women and 2 glasses a day for men. A glass of wine equals 5 oz (150 mL). This information  is not intended to replace advice given to you by your health care provider. Make sure you discuss any questions you have with your health care provider. Document Released: 08/31/2015 Document Revised: 10/03/2015 Document Reviewed: 08/31/2015 Elsevier Interactive Patient Education  2019 [employer].   Exercising to [employer] Exercise is structured, repetitive physical activity to improve fitness and health. Getting regular exercise is important for everyone. It is especially important if you are overweight. Being overweight increases your risk of heart disease, stroke, diabetes, high blood pressure, and several types of cancer. Reducing your calorie intake and exercising can help you lose weight. Exercise is usually categorized as moderate or vigorous intensity. To lose weight, most people need to do a certain amount of moderate-intensity or vigorous-intensity exercise each week. Moderate-intensity exercise  Moderate-intensity exercise is any activity that gets you moving enough to burn at least three times more energy (calories) than if you were sitting. Examples of moderate exercise include:  Walking a mile in 15 minutes.  Doing light yard work.  Biking at an easy pace. Most people should get at least 150 minutes (2 hours and 30 minutes) a week of moderate-intensity exercise to maintain their body weight. Vigorous-intensity exercise Vigorous-intensity exercise is any activity that gets  you moving enough to burn at least six times more calories than if you were sitting. When you exercise at this intensity, you should be working hard enough that you are not able to carry on a conversation. Examples of vigorous exercise include:  Running.  Playing a team sport, such as football, basketball, and soccer.  Jumping rope. Most people should get at least 75 minutes (1 hour and 15 minutes) a week of vigorous-intensity exercise to maintain their body weight. How can exercise affect me? When  you exercise enough to burn more calories than you eat, you lose weight. Exercise also reduces body fat and builds muscle. The more muscle you have, the more calories you burn. Exercise also:  Improves mood.  Reduces stress and tension.  Improves your overall fitness, flexibility, and endurance.  Increases bone strength. The amount of exercise you need to lose weight depends on:  Your age.  The type of exercise.  Any health conditions you have.  Your overall physical ability. Talk to your health care provider about how much exercise you need and what types of activities are safe for you. What actions can I take to lose weight? Nutrition   Make changes to your diet as told by your health care provider or diet and nutrition specialist (dietitian). This may include: ? Eating fewer calories. ? Eating more protein. ? Eating less unhealthy fats. ? Eating a diet that includes fresh fruits and vegetables, whole grains, low-fat dairy products, and lean protein. ? Avoiding foods with added fat, salt, and sugar.  Drink plenty of water while you exercise to prevent dehydration or heat stroke. Activity  Choose an activity that you enjoy and set realistic goals. Your health care provider can help you make an exercise plan that works for you.  Exercise at a moderate or vigorous intensity most days of the week. ? The intensity of exercise may vary from person to person. You can tell how intense a workout is for you by paying attention to your breathing and heartbeat. Most people will notice their breathing and heartbeat get faster with more intense exercise.  Do resistance training twice each week, such as: ? Push-ups. ? Sit-ups. ? Lifting weights. ? Using resistance bands.  Getting short amounts of exercise can be just as helpful as long structured periods of exercise. If you have trouble finding time to exercise, try to include exercise in your daily routine. ? Get up, stretch, and  walk around every 30 minutes throughout the day. ? Go for a walk during your lunch break. ? Park your car farther away from your destination. ? If you take public transportation, get off one stop early and walk the rest of the way. ? Make phone calls while standing up and walking around. ? Take the stairs instead of elevators or escalators.  Wear comfortable clothes and shoes with good support.  Do not exercise so much that you hurt yourself, feel dizzy, or get very short of breath. Where to find more information  [place] Department of Health and Human Services: ThisPath.fi  Centers for Disease Control and Prevention (CDC): FootballExhibition.com.br Contact a health care provider:  Before starting a new exercise program.  If you have questions or concerns about your weight.  If you have a medical problem that keeps you from exercising. Get help right away if you have any of the following while exercising:  Injury.  Dizziness.  Difficulty breathing or shortness of breath that does not go away when you stop  exercising.  Chest pain.  Rapid heartbeat. Summary  Being overweight increases your risk of heart disease, stroke, diabetes, high blood pressure, and several types of cancer.  Losing weight happens when you burn more calories than you eat.  Reducing the amount of calories you eat in addition to getting regular moderate or vigorous exercise each week helps you lose weight. This information is not intended to replace advice given to you by your health care provider. Make sure you discuss any questions you have with your health care provider. Document Released: 02/09/2010 Document Revised: 01/20/2017 Document Reviewed: 01/20/2017 Elsevier Interactive Patient Education  2019 [employer] Inc[employer]  SO SO SO PROUD OF YOUR HARD WORK! Continue to eat healthy diet and remain as active as possible. Referral placed to [facility] to help you continue to reach your goal- YOU CAN DO  IT! Recommend complete physical with fasting labs in 6 months. GREAT TO SEE YOU!

## 2018-02-04 NOTE — Assessment & Plan Note (Signed)
Body mass index is 51.13 kg/m.  Starting wt 408, current wt 333, goal wt 250 Has been on phentermine 12 months- now weaned off Referred to [facility] Continue Mediterranean Diet and Regular Exercise

## 2018-08-05 ENCOUNTER — Other Ambulatory Visit: Payer: Managed Care, Other (non HMO)

## 2018-08-05 ENCOUNTER — Other Ambulatory Visit: Payer: Self-pay

## 2018-08-05 DIAGNOSIS — Z0000 Encounter for general adult medical examination without abnormal findings: Secondary | ICD-10-CM

## 2018-08-06 LAB — CBC WITH DIFFERENTIAL/PLATELET
Basophils Absolute: 0 10*3/uL (ref 0.0–0.2)
Basos: 1 %
EOS (ABSOLUTE): 0.1 10*3/uL (ref 0.0–0.4)
Eos: 1 %
Hematocrit: 38.3 % (ref 34.0–46.6)
Hemoglobin: 13.1 g/dL (ref 11.1–15.9)
Immature Grans (Abs): 0 10*3/uL (ref 0.0–0.1)
Immature Granulocytes: 0 %
Lymphocytes Absolute: 1.9 10*3/uL (ref 0.7–3.1)
Lymphs: 30 %
MCH: 30 pg (ref 26.6–33.0)
MCHC: 34.2 g/dL (ref 31.5–35.7)
MCV: 88 fL (ref 79–97)
Monocytes Absolute: 0.5 10*3/uL (ref 0.1–0.9)
Monocytes: 8 %
Neutrophils Absolute: 3.9 10*3/uL (ref 1.4–7.0)
Neutrophils: 60 %
Platelets: 264 10*3/uL (ref 150–450)
RBC: 4.37 x10E6/uL (ref 3.77–5.28)
RDW: 13 % (ref 11.7–15.4)
WBC: 6.4 10*3/uL (ref 3.4–10.8)

## 2018-08-06 LAB — COMPREHENSIVE METABOLIC PANEL
ALT: 17 IU/L (ref 0–32)
AST: 17 IU/L (ref 0–40)
Albumin/Globulin Ratio: 1.9 (ref 1.2–2.2)
Albumin: 4.1 g/dL (ref 3.8–4.8)
Alkaline Phosphatase: 69 IU/L (ref 39–117)
BUN/Creatinine Ratio: 12 (ref 9–23)
BUN: 10 mg/dL (ref 6–20)
Bilirubin Total: 0.5 mg/dL (ref 0.0–1.2)
CO2: 20 mmol/L (ref 20–29)
Calcium: 9 mg/dL (ref 8.7–10.2)
Chloride: 104 mmol/L (ref 96–106)
Creatinine, Ser: 0.82 mg/dL (ref 0.57–1.00)
GFR calc Af Amer: 110 mL/min/{1.73_m2} (ref >59)
GFR calc non Af Amer: 96 mL/min/{1.73_m2} (ref >59)
Globulin, Total: 2.2 g/dL (ref 1.5–4.5)
Glucose: 93 mg/dL (ref 65–99)
Potassium: 3.9 mmol/L (ref 3.5–5.2)
Sodium: 140 mmol/L (ref 134–144)
Total Protein: 6.3 g/dL (ref 6.0–8.5)

## 2018-08-06 LAB — LIPID PANEL
Chol/HDL Ratio: 3.9 ratio (ref 0.0–4.4)
Cholesterol, Total: 153 mg/dL (ref 100–199)
HDL: 39 mg/dL — ABNORMAL LOW (ref >39)
LDL Calculated: 98 mg/dL (ref 0–99)
Triglycerides: 81 mg/dL (ref 0–149)
VLDL Cholesterol Cal: 16 mg/dL (ref 5–40)

## 2018-08-06 LAB — HEMOGLOBIN A1C
Est. average glucose Bld gHb Est-mCnc: 105 mg/dL
Hgb A1c MFr Bld: 5.3 % (ref 4.8–5.6)

## 2018-08-06 LAB — TSH: TSH: 1.32 u[IU]/mL (ref 0.450–4.500)

## 2018-08-11 NOTE — Progress Notes (Signed)
Subjective:    Patient ID: Sandy CrankerKristen Christensen, female    DOB: 1985/07/10, 33 y.o.   MRN: [id number]  HPI:  [name] is here for CPE She continues to drink >gallon water/day She has been following a lower CHO diet- wt steady- current wt 336 She has lost >115 lbs since beginning her weigh loss journey and states "I have never felt better". 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 Healthcare maintenance 11/03/2017  . BMI 50.0-59.9, adult (HCC) 11/03/2017  . Grief 02/24/2017  . Breast pain 06/27/2016  . Acute carpal tunnel syndrome of right wrist 06/27/2016  . Morbid obesity (HCC) 04/24/2016  . Back pain 04/24/2016  . Epidermal cyst of ear 04/24/2016  . Health care maintenance 04/24/2016  . Closed nondisplaced fracture of middle phalanx of left ring finger with routine healing 08/02/2015     History reviewed. No pertinent past medical history.   History reviewed. No pertinent surgical history.   Family History  Problem Relation Age of Onset  . Hypertension Mother   . Cancer Father        skin  . Healthy Sister   . Healthy Brother   . Healthy Sister   . Healthy Sister   . Healthy Brother      Social History   Substance and Sexual Activity  Drug Use No     Social History   Substance and Sexual Activity  Alcohol Use Yes   Comment: Once a month     Social History   Tobacco Use  Smoking Status Former Smoker  . Packs/day: 0.50   . Years: 2.00  . Pack years: 1.00  . Types: Cigarettes  . Quit date: 01/21/1998  . Years since quitting: 20.5  Smokeless Tobacco Never Used     Outpatient Encounter Medications as of 08/12/2018  Medication Sig  . Ascorbic Acid (VITAMIN C PO) Take 1 tablet by mouth daily.  Marland Kitchen. aspirin-acetaminophen-caffeine (EXCEDRIN MIGRAINE) 250-250-65 MG tablet Take 2 tablets by mouth every 6 (six) hours as needed for headache.  . Biotin w/ Vitamins C & E (HAIR/SKIN/NAILS PO) Take 1 tablet by mouth daily.   . cetirizine (ZYRTEC) 10 MG tablet Take 10 mg by mouth daily.  . cholecalciferol (VITAMIN D) 1000 units tablet Take 2,000 Units by mouth daily.  . Multiple Vitamins-Minerals (ECHINACEA ACZ PO) Take 1 tablet by mouth daily.  . RaNITidine HCl (ZANTAC PO) Take 1 tablet by mouth daily as needed (indigestion.).  . [DISCONTINUED] phentermine (ADIPEX-P) 37.5 MG tablet Take 1 tablet (37.5 mg total) by mouth daily before breakfast.   No facility-administered encounter medications on file as of 08/12/2018.     Allergies: Latex and Neosporin [neomycin-bacitracin zn-polymyx]  Body mass index is 52.25 kg/m.  Blood pressure 114/73, pulse 63, temperature 98.6 F (37 C), temperature source Oral, height 5' 7.25" (1.708 m), weight (!) 336 lb 1.6 oz (152.5 kg), last menstrual period 07/30/2018, SpO2 99 %.  Review of Systems  Constitutional: Negative for activity change, appetite change, chills, diaphoresis, fatigue, fever and unexpected weight change.  HENT: Negative for congestion.   Eyes: Negative for visual disturbance.  Respiratory: Negative for cough, chest tightness, shortness of breath, wheezing and stridor.   Cardiovascular: Negative for chest pain, palpitations and leg swelling.  Gastrointestinal: Negative for abdominal distention, abdominal pain, blood in stool, constipation, diarrhea, nausea and vomiting.  Endocrine: Negative for cold intolerance, heat intolerance, polydipsia, polyphagia and polyuria.   Genitourinary: Negative for difficulty urinating and flank pain.  Musculoskeletal: Negative for arthralgias, back pain, gait problem, joint swelling, myalgias, neck pain and neck stiffness.  Skin: Negative for color change, pallor, rash and wound.  Neurological: Negative for dizziness and headaches.  Hematological: Negative for adenopathy. Does not bruise/bleed easily.  Psychiatric/Behavioral: Negative for agitation, behavioral problems, confusion, decreased concentration, dysphoric mood, hallucinations, self-injury, sleep disturbance and suicidal ideas. The patient is not nervous/anxious and is not hyperactive.        Objective:   Physical Exam Vitals signs and nursing note reviewed.  Constitutional:      General: She is not in acute distress.    Appearance: Normal appearance. She is obese. She is not ill-appearing, toxic-appearing or diaphoretic.  HENT:     Head: Normocephalic and atraumatic.     Right Ear: Tympanic membrane, ear canal and external ear normal.     Left Ear: Tympanic membrane, ear canal and external ear normal.     Nose: Nose normal. No congestion.     Mouth/Throat:     Mouth: Mucous membranes are moist.     Pharynx: No oropharyngeal exudate.  Eyes:     Extraocular Movements: Extraocular movements intact.     Conjunctiva/sclera: Conjunctivae normal.     Pupils: Pupils are equal, round, and reactive to light.  Neck:     Musculoskeletal: Normal range of motion. No muscular tenderness.  Cardiovascular:     Rate and Rhythm: Normal rate.     Pulses: Normal pulses.     Heart sounds: Normal heart sounds. No murmur. No friction rub. No gallop.   Pulmonary:     Effort: Pulmonary effort is normal. No respiratory distress.     Breath sounds: Normal breath sounds. No stridor. No wheezing, rhonchi or rales.  Chest:     Chest wall: No tenderness.  Abdominal:     General: Abdomen is protuberant. Bowel sounds are normal.     Palpations: Abdomen is soft.     Tenderness:  There is no abdominal tenderness. There is no right CVA tenderness, left CVA tenderness, guarding or rebound.  Musculoskeletal: Normal range of motion.        General: No tenderness.  Skin:    General: Skin is warm and dry.     Capillary Refill: Capillary refill takes less than 2 seconds.  Neurological:     Mental Status: She is alert and oriented to person, place, and time.     Coordination: Coordination normal.  Psychiatric:        Mood and Affect: Mood normal.        Behavior: Behavior normal.        Thought Content: Thought content normal.        Judgment: Judgment normal.        Assessment & Plan:   1. Health care maintenance     Health care maintenance Your blood pressure and lab work look great! Excellent job on your steady weight loss!!!!! Remain well hydrated, follow heart healthy diet, and continue  to be as active as possible. Please schedule follow-up with your OB/GYN. Continue to social distance and wear and mask when in public.    FOLLOW-UP:  Return in about 1 year (around 08/12/2019) for Fasting Labs, CPE.

## 2018-08-12 ENCOUNTER — Other Ambulatory Visit: Payer: Self-pay

## 2018-08-12 ENCOUNTER — Ambulatory Visit (INDEPENDENT_AMBULATORY_CARE_PROVIDER_SITE_OTHER): Payer: Managed Care, Other (non HMO) | Admitting: Adult Health

## 2018-08-12 ENCOUNTER — Encounter: Payer: Self-pay | Admitting: Adult Health

## 2018-08-12 DIAGNOSIS — Z0000 Encounter for general adult medical examination without abnormal findings: Secondary | ICD-10-CM

## 2018-08-12 NOTE — Patient Instructions (Addendum)
Preventive Care for Adults, Female  A healthy lifestyle and preventive care can promote health and wellness. Preventive health guidelines for women include the following key practices.   A routine yearly physical is a good way to check with your health care provider about your health and preventive screening. It is a chance to share any concerns and updates on your health and to receive a thorough exam.   Visit your dentist for a routine exam and preventive care every 6 months. Brush your teeth twice a day and floss once a day. Good oral hygiene prevents tooth decay and gum disease.   The frequency of eye exams is based on your age, health, family medical history, use of contact lenses, and other factors. Follow your health care provider's recommendations for frequency of eye exams.   Eat a healthy diet. Foods like vegetables, fruits, whole grains, low-fat dairy products, and lean protein foods contain the nutrients you need without too many calories. Decrease your intake of foods high in solid fats, added sugars, and salt. Eat the right amount of calories for you.Get information about a proper diet from your health care provider, if necessary.   Regular physical exercise is one of the most important things you can do for your health. Most adults should get at least 150 minutes of moderate-intensity exercise (any activity that increases your heart rate and causes you to sweat) each week. In addition, most adults need muscle-strengthening exercises on 2 or more days a week.   Maintain a healthy weight. The body mass index (BMI) is a screening tool to identify possible weight problems. It provides an estimate of body fat based on height and weight. Your health care provider can find your BMI, and can help you achieve or maintain a healthy weight.For adults 20 years and older:   - A BMI below 18.5 is considered underweight.   - A BMI of 18.5 to 24.9 is normal.   - A BMI of 25 to 29.9 is  considered overweight.   - A BMI of 30 and above is considered obese.   Maintain normal blood lipids and cholesterol levels by exercising and minimizing your intake of trans and saturated fats.  Eat a balanced diet with plenty of fruit and vegetables. Blood tests for lipids and cholesterol should begin at age 20 and be repeated every 5 years minimum.  If your lipid or cholesterol levels are high, you are over 40, or you are at high risk for heart disease, you may need your cholesterol levels checked more frequently.Ongoing high lipid and cholesterol levels should be treated with medicines if diet and exercise are not working.   If you smoke, find out from your health care provider how to quit. If you do not use tobacco, do not start.   Lung cancer screening is recommended for adults aged 55-80 years who are at high risk for developing lung cancer because of a history of smoking. A yearly low-dose CT scan of the lungs is recommended for people who have at least a 30-pack-year history of smoking and are a current smoker or have quit within the past 15 years. A pack year of smoking is smoking an average of 1 pack of cigarettes a day for 1 year (for example: 1 pack a day for 30 years or 2 packs a day for 15 years). Yearly screening should continue until the smoker has stopped smoking for at least 15 years. Yearly screening should be stopped for people who develop a   health problem that would prevent them from having lung cancer treatment.   If you are pregnant, do not drink alcohol. If you are breastfeeding, be very cautious about drinking alcohol. If you are not pregnant and choose to drink alcohol, do not have more than 1 drink per day. One drink is considered to be 12 ounces (355 mL) of beer, 5 ounces (148 mL) of wine, or 1.5 ounces (44 mL) of liquor.   Avoid use of street drugs. Do not share needles with anyone. Ask for help if you need support or instructions about stopping the use of  drugs.   High blood pressure causes heart disease and increases the risk of stroke. Your blood pressure should be checked at least yearly.  Ongoing high blood pressure should be treated with medicines if weight loss and exercise do not work.   If you are 69-55 years old, ask your health care provider if you should take aspirin to prevent strokes.   Diabetes screening involves taking a blood sample to check your fasting blood sugar level. This should be done once every 3 years, after age 38, if you are within normal weight and without risk factors for diabetes. Testing should be considered at a younger age or be carried out more frequently if you are overweight and have at least 1 risk factor for diabetes.   Breast cancer screening is essential preventive care for women. You should practice "breast self-awareness."  This means understanding the normal appearance and feel of your breasts and may include breast self-examination.  Any changes detected, no matter how small, should be reported to a health care provider.  Women in their 80s and 30s should have a clinical breast exam (CBE) by a health care provider as part of a regular health exam every 1 to 3 years.  After age 66, women should have a CBE every year.  Starting at age 1, women should consider having a mammogram (breast X-ray test) every year.  Women who have a family history of breast cancer should talk to their health care provider about genetic screening.  Women at a high risk of breast cancer should talk to their health care providers about having an MRI and a mammogram every year.   -Breast cancer gene (BRCA)-related cancer risk assessment is recommended for women who have family members with BRCA-related cancers. BRCA-related cancers include breast, ovarian, tubal, and peritoneal cancers. Having family members with these cancers may be associated with an increased risk for harmful changes (mutations) in the breast cancer genes BRCA1 and  BRCA2. Results of the assessment will determine the need for genetic counseling and BRCA1 and BRCA2 testing.   The Pap test is a screening test for cervical cancer. A Pap test can show cell changes on the cervix that might become cervical cancer if left untreated. A Pap test is a procedure in which cells are obtained and examined from the lower end of the uterus (cervix).   - Women should have a Pap test starting at age 57.   - Between ages 90 and 70, Pap tests should be repeated every 2 years.   - Beginning at age 63, you should have a Pap test every 3 years as long as the past 3 Pap tests have been normal.   - Some women have medical problems that increase the chance of getting cervical cancer. Talk to your health care provider about these problems. It is especially important to talk to your health care provider if a  new problem develops soon after your last Pap test. In these cases, your health care provider may recommend more frequent screening and Pap tests.   - The above recommendations are the same for women who have or have not gotten the vaccine for human papillomavirus (HPV).   - If you had a hysterectomy for a problem that was not cancer or a condition that could lead to cancer, then you no longer need Pap tests. Even if you no longer need a Pap test, a regular exam is a good idea to make sure no other problems are starting.   - If you are between ages 36 and 66 years, and you have had normal Pap tests going back 10 years, you no longer need Pap tests. Even if you no longer need a Pap test, a regular exam is a good idea to make sure no other problems are starting.   - If you have had past treatment for cervical cancer or a condition that could lead to cancer, you need Pap tests and screening for cancer for at least 20 years after your treatment.   - If Pap tests have been discontinued, risk factors (such as a new sexual partner) need to be reassessed to determine if screening should  be resumed.   - The HPV test is an additional test that may be used for cervical cancer screening. The HPV test looks for the virus that can cause the cell changes on the cervix. The cells collected during the Pap test can be tested for HPV. The HPV test could be used to screen women aged 70 years and older, and should be used in women of any age who have unclear Pap test results. After the age of 67, women should have HPV testing at the same frequency as a Pap test.   Colorectal cancer can be detected and often prevented. Most routine colorectal cancer screening begins at the age of 57 years and continues through age 26 years. However, your health care provider may recommend screening at an earlier age if you have risk factors for colon cancer. On a yearly basis, your health care provider may provide home test kits to check for hidden blood in the stool.  Use of a small camera at the end of a tube, to directly examine the colon (sigmoidoscopy or colonoscopy), can detect the earliest forms of colorectal cancer. Talk to your health care provider about this at age 23, when routine screening begins. Direct exam of the colon should be repeated every 5 -10 years through age 49 years, unless early forms of pre-cancerous polyps or small growths are found.   People who are at an increased risk for hepatitis B should be screened for this virus. You are considered at high risk for hepatitis B if:  -You were born in a country where hepatitis B occurs often. Talk with your health care provider about which countries are considered high risk.  - Your parents were born in a high-risk country and you have not received a shot to protect against hepatitis B (hepatitis B vaccine).  - You have HIV or AIDS.  - You use needles to inject street drugs.  - You live with, or have sex with, someone who has Hepatitis B.  - You get hemodialysis treatment.  - You take certain medicines for conditions like cancer, organ  transplantation, and autoimmune conditions.   Hepatitis C blood testing is recommended for all people born from [birth year] through 1965 and any individual  with known risks for hepatitis C.   Practice safe sex. Use condoms and avoid high-risk sexual practices to reduce the spread of sexually transmitted infections (STIs). STIs include gonorrhea, chlamydia, syphilis, trichomonas, herpes, HPV, and human immunodeficiency virus (HIV). Herpes, HIV, and HPV are viral illnesses that have no cure. They can result in disability, cancer, and death. Sexually active women aged 25 years and younger should be checked for chlamydia. Older women with new or multiple partners should also be tested for chlamydia. Testing for other STIs is recommended if you are sexually active and at increased risk.   Osteoporosis is a disease in which the bones lose minerals and strength with aging. This can result in serious bone fractures or breaks. The risk of osteoporosis can be identified using a bone density scan. Women ages 65 years and over and women at risk for fractures or osteoporosis should discuss screening with their health care providers. Ask your health care provider whether you should take a calcium supplement or vitamin D to There are also several preventive steps women can take to avoid osteoporosis and resulting fractures or to keep osteoporosis from worsening. -->Recommendations include:  Eat a balanced diet high in fruits, vegetables, calcium, and vitamins.  Get enough calcium. The recommended total intake of is 1,200 mg daily; for best absorption, if taking supplements, divide doses into 250-500 mg doses throughout the day. Of the two types of calcium, calcium carbonate is best absorbed when taken with food but calcium citrate can be taken on an empty stomach.  Get enough vitamin D. NAMS and the National Osteoporosis Foundation recommend at least 1,000 IU per day for women age 50 and over who are at risk of vitamin D  deficiency. Vitamin D deficiency can be caused by inadequate sun exposure (for example, those who live in northern latitudes).  Avoid alcohol and smoking. Heavy alcohol intake (more than 7 drinks per week) increases the risk of falls and hip fracture and women smokers tend to lose bone more rapidly and have lower bone mass than nonsmokers. Stopping smoking is one of the most important changes women can make to improve their health and decrease risk for disease.  Be physically active every day. Weight-bearing exercise (for example, fast walking, hiking, jogging, and weight training) may strengthen bones or slow the rate of bone loss that comes with aging. Balancing and muscle-strengthening exercises can reduce the risk of falling and fracture.  Consider therapeutic medications. Currently, several types of effective drugs are available. Healthcare providers can recommend the type most appropriate for each woman.  Eliminate environmental factors that may contribute to accidents. Falls cause nearly 90% of all osteoporotic fractures, so reducing this risk is an important bone-health strategy. Measures include ample lighting, removing obstructions to walking, using nonskid rugs on floors, and placing mats and/or grab bars in showers.  Be aware of medication side effects. Some common medicines make bones weaker. These include a type of steroid drug called glucocorticoids used for arthritis and asthma, some antiseizure drugs, certain sleeping pills, treatments for endometriosis, and some cancer drugs. An overactive thyroid gland or using too much thyroid hormone for an underactive thyroid can also be a problem. If you are taking these medicines, talk to your doctor about what you can do to help protect your bones.reduce the rate of osteoporosis.    Menopause can be associated with physical symptoms and risks. Hormone replacement therapy is available to decrease symptoms and risks. You should talk to your  health care provider   about whether hormone replacement therapy is right for you.   Use sunscreen. Apply sunscreen liberally and repeatedly throughout the day. You should seek shade when your shadow is shorter than you. Protect yourself by wearing long sleeves, pants, a wide-brimmed hat, and sunglasses year round, whenever you are outdoors.   Once a month, do a whole body skin exam, using a mirror to look at the skin on your back. Tell your health care provider of new moles, moles that have irregular borders, moles that are larger than a pencil eraser, or moles that have changed in shape or color.   -Stay current with required vaccines (immunizations).   Influenza vaccine. All adults should be immunized every year.  Tetanus, diphtheria, and acellular pertussis (Td, Tdap) vaccine. Pregnant women should receive 1 dose of Tdap vaccine during each pregnancy. The dose should be obtained regardless of the length of time since the last dose. Immunization is preferred during the 27th 36th week of gestation. An adult who has not previously received Tdap or who does not know her vaccine status should receive 1 dose of Tdap. This initial dose should be followed by tetanus and diphtheria toxoids (Td) booster doses every 10 years. Adults with an unknown or incomplete history of completing a 3-dose immunization series with Td-containing vaccines should begin or complete a primary immunization series including a Tdap dose. Adults should receive a Td booster every 10 years.  Varicella vaccine. An adult without evidence of immunity to varicella should receive 2 doses or a second dose if she has previously received 1 dose. Pregnant females who do not have evidence of immunity should receive the first dose after pregnancy. This first dose should be obtained before leaving the health care facility. The second dose should be obtained 4 8 weeks after the first dose.  Human papillomavirus (HPV) vaccine. Females aged 13 26  years who have not received the vaccine previously should obtain the 3-dose series. The vaccine is not recommended for use in pregnant females. However, pregnancy testing is not needed before receiving a dose. If a female is found to be pregnant after receiving a dose, no treatment is needed. In that case, the remaining doses should be delayed until after the pregnancy. Immunization is recommended for any person with an immunocompromised condition through the age of 26 years if she did not get any or all doses earlier. During the 3-dose series, the second dose should be obtained 4 8 weeks after the first dose. The third dose should be obtained 24 weeks after the first dose and 16 weeks after the second dose.  Zoster vaccine. One dose is recommended for adults aged 60 years or older unless certain conditions are present.  Measles, mumps, and rubella (MMR) vaccine. Adults born before 1957 generally are considered immune to measles and mumps. Adults born in 1957 or later should have 1 or more doses of MMR vaccine unless there is a contraindication to the vaccine or there is laboratory evidence of immunity to each of the three diseases. A routine second dose of MMR vaccine should be obtained at least 28 days after the first dose for students attending postsecondary schools, health care workers, or international travelers. People who received inactivated measles vaccine or an unknown type of measles vaccine during 1963 1967 should receive 2 doses of MMR vaccine. People who received inactivated mumps vaccine or an unknown type of mumps vaccine before 1979 and are at high risk for mumps infection should consider immunization with 2 doses of   MMR vaccine. For females of childbearing age, rubella immunity should be determined. If there is no evidence of immunity, females who are not pregnant should be vaccinated. If there is no evidence of immunity, females who are pregnant should delay immunization until after pregnancy.  Unvaccinated health care workers born before [birth year] who lack laboratory evidence of measles, mumps, or rubella immunity or laboratory confirmation of disease should consider measles and mumps immunization with 2 doses of MMR vaccine or rubella immunization with 1 dose of MMR vaccine.  Pneumococcal 13-valent conjugate (PCV13) vaccine. When indicated, a person who is uncertain of her immunization history and has no record of immunization should receive the PCV13 vaccine. An adult aged 54 years or older who has certain medical conditions and has not been previously immunized should receive 1 dose of PCV13 vaccine. This PCV13 should be followed with a dose of pneumococcal polysaccharide (PPSV23) vaccine. The PPSV23 vaccine dose should be obtained at least 8 weeks after the dose of PCV13 vaccine. An adult aged 58 years or older who has certain medical conditions and previously received 1 or more doses of PPSV23 vaccine should receive 1 dose of PCV13. The PCV13 vaccine dose should be obtained 1 or more years after the last PPSV23 vaccine dose.  Pneumococcal polysaccharide (PPSV23) vaccine. When PCV13 is also indicated, PCV13 should be obtained first. All adults aged 58 years and older should be immunized. An adult younger than age 65 years who has certain medical conditions should be immunized. Any person who resides in a nursing home or long-term care facility should be immunized. An adult smoker should be immunized. People with an immunocompromised condition and certain other conditions should receive both PCV13 and PPSV23 vaccines. People with human immunodeficiency virus (HIV) infection should be immunized as soon as possible after diagnosis. Immunization during chemotherapy or radiation therapy should be avoided. Routine use of PPSV23 vaccine is not recommended for American Indians, [place] Natives, or people younger than 65 years unless there are medical conditions that require PPSV23 vaccine. When indicated,  people who have unknown immunization and have no record of immunization should receive PPSV23 vaccine. One-time revaccination 5 years after the first dose of PPSV23 is recommended for people aged 70 64 years who have chronic kidney failure, nephrotic syndrome, asplenia, or immunocompromised conditions. People who received 1 2 doses of PPSV23 before age 32 years should receive another dose of PPSV23 vaccine at age 96 years or later if at least 5 years have passed since the previous dose. Doses of PPSV23 are not needed for people immunized with PPSV23 at or after age 55 years.  Meningococcal vaccine. Adults with asplenia or persistent complement component deficiencies should receive 2 doses of quadrivalent meningococcal conjugate (MenACWY-D) vaccine. The doses should be obtained at least 2 months apart. Microbiologists working with certain meningococcal bacteria, [employer] recruits, people at risk during an outbreak, and people who travel to or live in countries with a high rate of meningitis should be immunized. A first-year college student up through age 58 years who is living in a residence hall should receive a dose if she did not receive a dose on or after her 16th birthday. Adults who have certain high-risk conditions should receive one or more doses of vaccine.  Hepatitis A vaccine. Adults who wish to be protected from this disease, have certain high-risk conditions, work with hepatitis A-infected animals, work in hepatitis A research labs, or travel to or work in countries with a high rate of hepatitis A should be  immunized. Adults who were previously unvaccinated and who anticipate close contact with an international adoptee during the first 60 days after arrival in the [place] from a country with a high rate of hepatitis A should be immunized.  Hepatitis B vaccine.  Adults who wish to be protected from this disease, have certain high-risk conditions, may be exposed to blood or other infectious  body fluids, are household contacts or sex partners of hepatitis B positive people, are clients or workers in certain care facilities, or travel to or work in countries with a high rate of hepatitis B should be immunized.  Haemophilus influenzae type b (Hib) vaccine. A previously unvaccinated person with asplenia or sickle cell disease or having a scheduled splenectomy should receive 1 dose of Hib vaccine. Regardless of previous immunization, a recipient of a hematopoietic stem cell transplant should receive a 3-dose series 6 12 months after her successful transplant. Hib vaccine is not recommended for adults with HIV infection.  Preventive Services / Frequency Ages 6 to 39years  Blood pressure check.** / Every 1 to 2 years.  Lipid and cholesterol check.** / Every 5 years beginning at age 39.  Clinical breast exam.** / Every 3 years for women in their 61s and 62s.  BRCA-related cancer risk assessment.** / For women who have family members with a BRCA-related cancer (breast, ovarian, tubal, or peritoneal cancers).  Pap test.** / Every 2 years from ages 47 through 85. Every 3 years starting at age 34 through age 12 or 74 with a history of 3 consecutive normal Pap tests.  HPV screening.** / Every 3 years from ages 46 through ages 43 to 54 with a history of 3 consecutive normal Pap tests.  Hepatitis C blood test.** / For any individual with known risks for hepatitis C.  Skin self-exam. / Monthly.  Influenza vaccine. / Every year.  Tetanus, diphtheria, and acellular pertussis (Tdap, Td) vaccine.** / Consult your health care provider. Pregnant women should receive 1 dose of Tdap vaccine during each pregnancy. 1 dose of Td every 10 years.  Varicella vaccine.** / Consult your health care provider. Pregnant females who do not have evidence of immunity should receive the first dose after pregnancy.  HPV vaccine. / 3 doses over 6 months, if 64 and younger. The vaccine is not recommended for use in  pregnant females. However, pregnancy testing is not needed before receiving a dose.  Measles, mumps, rubella (MMR) vaccine.** / You need at least 1 dose of MMR if you were born in 1957 or later. You may also need a 2nd dose. For females of childbearing age, rubella immunity should be determined. If there is no evidence of immunity, females who are not pregnant should be vaccinated. If there is no evidence of immunity, females who are pregnant should delay immunization until after pregnancy.  Pneumococcal 13-valent conjugate (PCV13) vaccine.** / Consult your health care provider.  Pneumococcal polysaccharide (PPSV23) vaccine.** / 1 to 2 doses if you smoke cigarettes or if you have certain conditions.  Meningococcal vaccine.** / 1 dose if you are age 71 to 37 years and a Market researcher living in a residence hall, or have one of several medical conditions, you need to get vaccinated against meningococcal disease. You may also need additional booster doses.  Hepatitis A vaccine.** / Consult your health care provider.  Hepatitis B vaccine.** / Consult your health care provider.  Haemophilus influenzae type b (Hib) vaccine.** / Consult your health care provider.  Ages 55 to 64years  Blood pressure check.** / Every 1 to 2 years.  Lipid and cholesterol check.** / Every 5 years beginning at age 20 years.  Lung cancer screening. / Every year if you are aged 55 80 years and have a 30-pack-year history of smoking and currently smoke or have quit within the past 15 years. Yearly screening is stopped once you have quit smoking for at least 15 years or develop a health problem that would prevent you from having lung cancer treatment.  Clinical breast exam.** / Every year after age 40 years.  BRCA-related cancer risk assessment.** / For women who have family members with a BRCA-related cancer (breast, ovarian, tubal, or peritoneal cancers).  Mammogram.** / Every year beginning at age 40  years and continuing for as long as you are in good health. Consult with your health care provider.  Pap test.** / Every 3 years starting at age 30 years through age 65 or 70 years with a history of 3 consecutive normal Pap tests.  HPV screening.** / Every 3 years from ages 30 years through ages 65 to 70 years with a history of 3 consecutive normal Pap tests.  Fecal occult blood test (FOBT) of stool. / Every year beginning at age 50 years and continuing until age 75 years. You may not need to do this test if you get a colonoscopy every 10 years.  Flexible sigmoidoscopy or colonoscopy.** / Every 5 years for a flexible sigmoidoscopy or every 10 years for a colonoscopy beginning at age 50 years and continuing until age 75 years.  Hepatitis C blood test.** / For all people born from 1945 through 1965 and any individual with known risks for hepatitis C.  Skin self-exam. / Monthly.  Influenza vaccine. / Every year.  Tetanus, diphtheria, and acellular pertussis (Tdap/Td) vaccine.** / Consult your health care provider. Pregnant women should receive 1 dose of Tdap vaccine during each pregnancy. 1 dose of Td every 10 years.  Varicella vaccine.** / Consult your health care provider. Pregnant females who do not have evidence of immunity should receive the first dose after pregnancy.  Zoster vaccine.** / 1 dose for adults aged 60 years or older.  Measles, mumps, rubella (MMR) vaccine.** / You need at least 1 dose of MMR if you were born in 1957 or later. You may also need a 2nd dose. For females of childbearing age, rubella immunity should be determined. If there is no evidence of immunity, females who are not pregnant should be vaccinated. If there is no evidence of immunity, females who are pregnant should delay immunization until after pregnancy.  Pneumococcal 13-valent conjugate (PCV13) vaccine.** / Consult your health care provider.  Pneumococcal polysaccharide (PPSV23) vaccine.** / 1 to 2 doses if  you smoke cigarettes or if you have certain conditions.  Meningococcal vaccine.** / Consult your health care provider.  Hepatitis A vaccine.** / Consult your health care provider.  Hepatitis B vaccine.** / Consult your health care provider.  Haemophilus influenzae type b (Hib) vaccine.** / Consult your health care provider.  Ages 65 years and over  Blood pressure check.** / Every 1 to 2 years.  Lipid and cholesterol check.** / Every 5 years beginning at age 20 years.  Lung cancer screening. / Every year if you are aged 55 80 years and have a 30-pack-year history of smoking and currently smoke or have quit within the past 15 years. Yearly screening is stopped once you have quit smoking for at least 15 years or develop a health problem that   would prevent you from having lung cancer treatment.  Clinical breast exam.** / Every year after age 103 years.  BRCA-related cancer risk assessment.** / For women who have family members with a BRCA-related cancer (breast, ovarian, tubal, or peritoneal cancers).  Mammogram.** / Every year beginning at age 36 years and continuing for as long as you are in good health. Consult with your health care provider.  Pap test.** / Every 3 years starting at age 5 years through age 85 or 10 years with 3 consecutive normal Pap tests. Testing can be stopped between 65 and 70 years with 3 consecutive normal Pap tests and no abnormal Pap or HPV tests in the past 10 years.  HPV screening.** / Every 3 years from ages 93 years through ages 70 or 45 years with a history of 3 consecutive normal Pap tests. Testing can be stopped between 65 and 70 years with 3 consecutive normal Pap tests and no abnormal Pap or HPV tests in the past 10 years.  Fecal occult blood test (FOBT) of stool. / Every year beginning at age 8 years and continuing until age 45 years. You may not need to do this test if you get a colonoscopy every 10 years.  Flexible sigmoidoscopy or colonoscopy.** /  Every 5 years for a flexible sigmoidoscopy or every 10 years for a colonoscopy beginning at age 69 years and continuing until age 68 years.  Hepatitis C blood test.** / For all people born from [birth year] through 1964 and any individual with known risks for hepatitis C.  Osteoporosis screening.** / A one-time screening for women ages 7 years and over and women at risk for fractures or osteoporosis.  Skin self-exam. / Monthly.  Influenza vaccine. / Every year.  Tetanus, diphtheria, and acellular pertussis (Tdap/Td) vaccine.** / 1 dose of Td every 10 years.  Varicella vaccine.** / Consult your health care provider.  Zoster vaccine.** / 1 dose for adults aged 5 years or older.  Pneumococcal 13-valent conjugate (PCV13) vaccine.** / Consult your health care provider.  Pneumococcal polysaccharide (PPSV23) vaccine.** / 1 dose for all adults aged 74 years and older.  Meningococcal vaccine.** / Consult your health care provider.  Hepatitis A vaccine.** / Consult your health care provider.  Hepatitis B vaccine.** / Consult your health care provider.  Haemophilus influenzae type b (Hib) vaccine.** / Consult your health care provider. ** Family history and personal history of risk and conditions may change your health care provider's recommendations. Document Released: 03/05/2001 Document Revised: 10/28/2012  [facility] Patient Information 2014 [place], [place].   EXERCISE AND DIET:  We recommended that you start or continue a regular exercise program for good health. Regular exercise means any activity that makes your heart beat faster and makes you sweat.  We recommend exercising at least 30 minutes per day at least 3 days a week, preferably 5.  We also recommend a diet low in fat and sugar / carbohydrates.  Inactivity, poor dietary choices and obesity can cause diabetes, heart attack, stroke, and kidney damage, among others.     ALCOHOL AND SMOKING:  Women should limit their alcohol intake to no  more than 7 drinks/beers/glasses of wine (combined, not each!) per week. Moderation of alcohol intake to this level decreases your risk of breast cancer and liver damage.  ( And of course, no recreational drugs are part of a healthy lifestyle.)  Also, you should not be smoking at all or even being exposed to second hand smoke. Most people know smoking can  cause cancer, and various heart and lung diseases, but did you know it also contributes to weakening of your bones?  Aging of your skin?  Yellowing of your teeth and nails?   CALCIUM AND VITAMIN D:  Adequate intake of calcium and Vitamin D are recommended.  The recommendations for exact amounts of these supplements seem to change often, but generally speaking 600 mg of calcium (either carbonate or citrate) and 800 units of Vitamin D per day seems prudent. Certain women may benefit from higher intake of Vitamin D.  If you are among these women, your doctor will have told you during your visit.     PAP SMEARS:  Pap smears, to check for cervical cancer or precancers,  have traditionally been done yearly, although recent scientific advances have shown that most women can have pap smears less often.  However, every woman still should have a physical exam from her gynecologist or primary care physician every year. It will include a breast check, inspection of the vulva and vagina to check for abnormal growths or skin changes, a visual exam of the cervix, and then an exam to evaluate the size and shape of the uterus and ovaries.  And after 32 years of age, a rectal exam is indicated to check for rectal cancers. We will also provide age appropriate advice regarding health maintenance, like when you should have certain vaccines, screening for sexually transmitted diseases, bone density testing, colonoscopy, mammograms, etc.    MAMMOGRAMS:  All women over 53 years old should have a yearly mammogram. Many facilities now offer a "3D" mammogram, which may cost  around $50 extra out of pocket. If possible,  we recommend you accept the option to have the 3D mammogram performed.  It both reduces the number of women who will be called back for extra views which then turn out to be normal, and it is better than the routine mammogram at detecting truly abnormal areas.     COLONOSCOPY:  Colonoscopy to screen for colon cancer is recommended for all women at age 70.  We know, you hate the idea of the prep.  We agree, BUT, having colon cancer and not knowing it is worse!!  Colon cancer so often starts as a polyp that can be seen and removed at colonscopy, which can quite literally save your life!  And if your first colonoscopy is normal and you have no family history of colon cancer, most women don't have to have it again for 10 years.  Once every ten years, you can do something that may end up saving your life, right?  We will be happy to help you get it scheduled when you are ready.  Be sure to check your insurance coverage so you understand how much it will cost.  It may be covered as a preventative service at no cost, but you should check your particular policy.   Your blood pressure and lab work look great! Excellent job on your steady weight loss!!!!! Remain well hydrated, follow heart healthy diet, and continue to be as active as possible. Please schedule follow-up with your OB/GYN. Continue to social distance and wear and mask when in public. GREAT TO SEE YOU!

## 2018-08-12 NOTE — Assessment & Plan Note (Signed)
Your blood pressure and lab work look great! Excellent job on your steady weight loss!!!!! Remain well hydrated, follow heart healthy diet, and continue to be as active as possible. Please schedule follow-up with your OB/GYN. Continue to social distance and wear and mask when in public.

## 2018-08-13 ENCOUNTER — Encounter: Payer: Self-pay | Admitting: Adult Health

## 2019-01-18 ENCOUNTER — Ambulatory Visit: Payer: HRSA Program | Attending: Internal Medicine

## 2019-01-18 DIAGNOSIS — Z20828 Contact with and (suspected) exposure to other viral communicable diseases: Secondary | ICD-10-CM | POA: Diagnosis not present

## 2019-01-18 DIAGNOSIS — Z20822 Contact with and (suspected) exposure to covid-19: Secondary | ICD-10-CM

## 2019-01-19 LAB — NOVEL CORONAVIRUS, NAA: SARS-CoV-2, NAA: NOT DETECTED

## 2019-02-22 ENCOUNTER — Telehealth: Payer: Self-pay | Admitting: Adult Health

## 2019-02-22 NOTE — Telephone Encounter (Signed)
After multiple attempts to obtain Health Maintenance information for [facility]) fro several diff source  [phone]-- I have recruited pt's assistance in obtaining her records from proir provider (she has agreed to contact them & have records sent to [place].  --FYI to med asst.  glh

## 2019-06-06 IMAGING — CT CT RENAL STONE PROTOCOL
2 of 3 series · 16 of 46 positions shown, 18 images · non-contrast
Comparison: None.

CLINICAL DATA: Right flank pain and microhematuria

EXAM:
CT ABDOMEN AND PELVIS WITHOUT CONTRAST
TECHNIQUE: Multidetector CT imaging of the abdomen and pelvis was performed
following the standard protocol without IV contrast.

[Series 3: lung · axial · 0.82mm/px · z∈[+1553,+1649]mm · 13 of 56 slices shown, 15 images]
[im 4/56  soft-tissue]
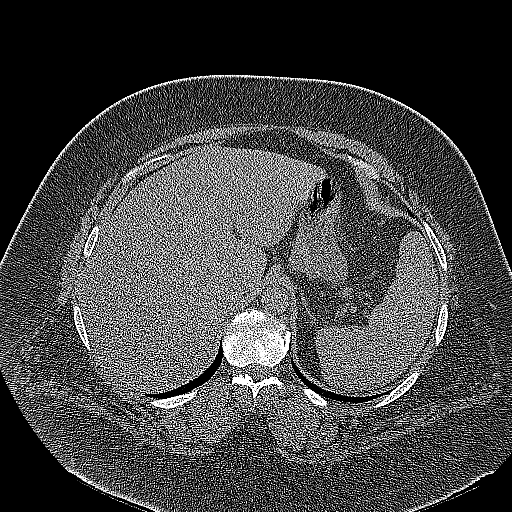
[im 4/56  bone]
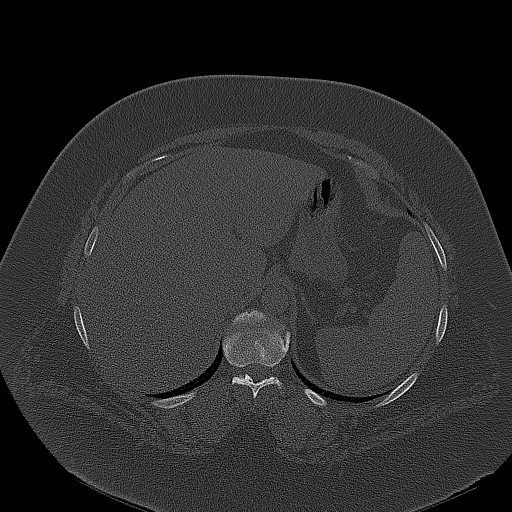
[im 8/56  soft-tissue]
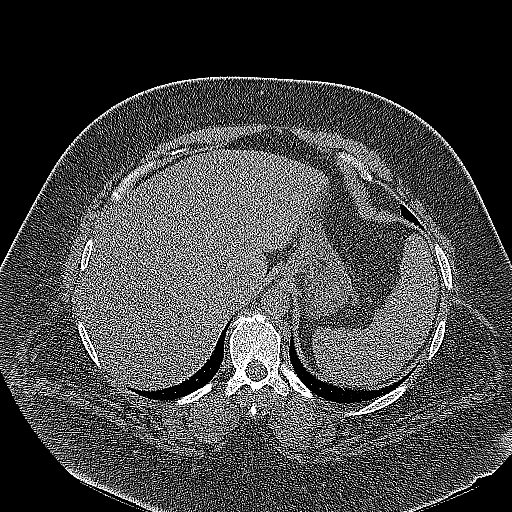
[im 11/56  soft-tissue]
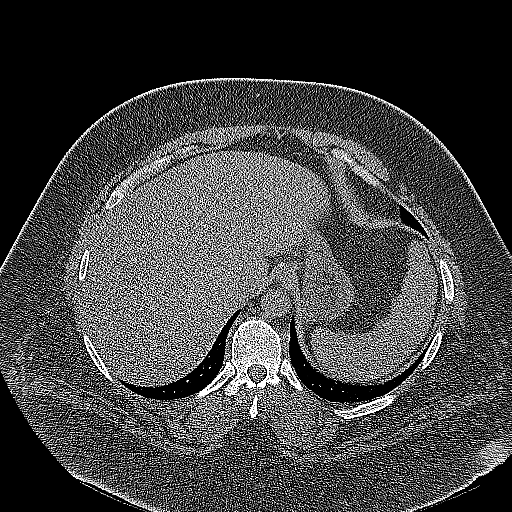
[im 16/56  soft-tissue]
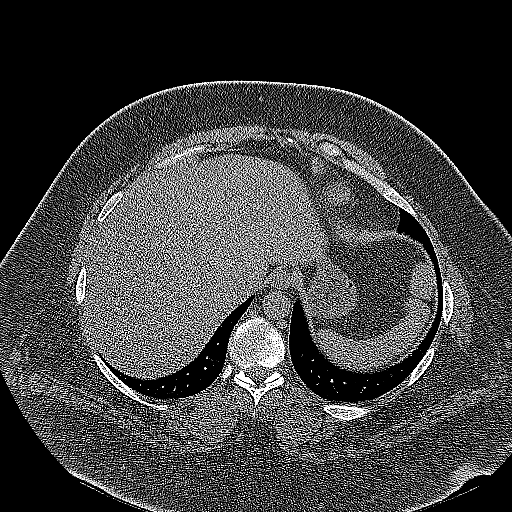
[im 20/56  soft-tissue]
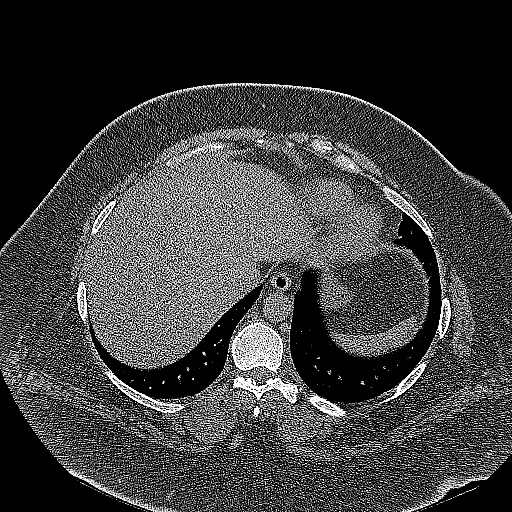
[im 24/56  soft-tissue]
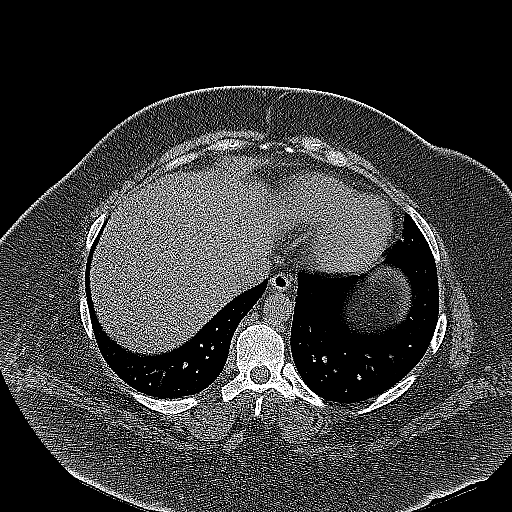
[im 29/56  soft-tissue]
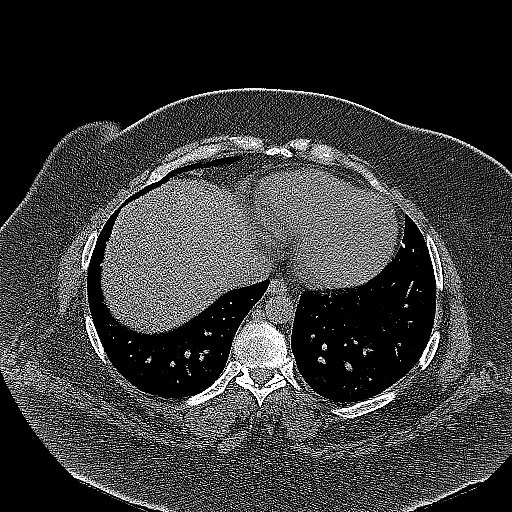
[im 32/56  soft-tissue]
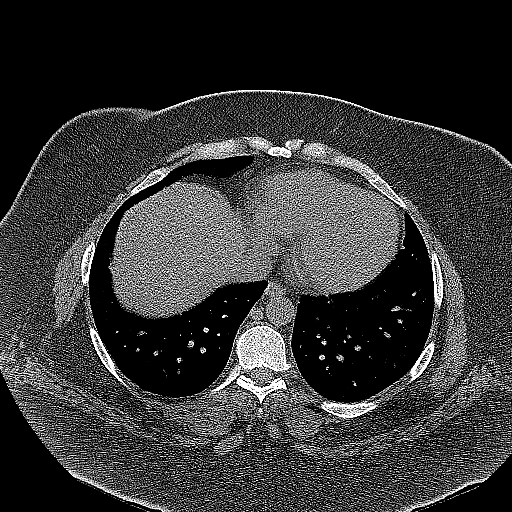
[im 36/56  soft-tissue]
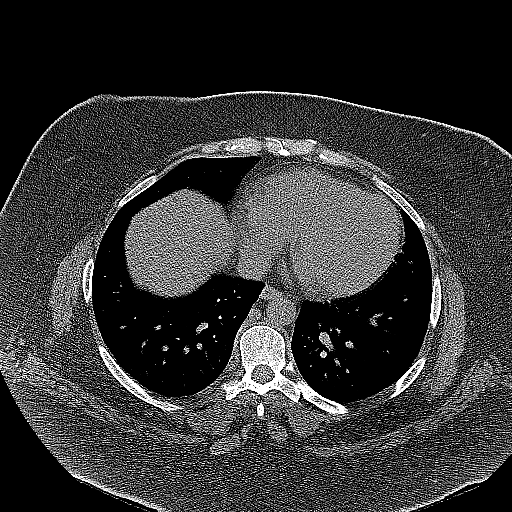
[im 36/56  bone]
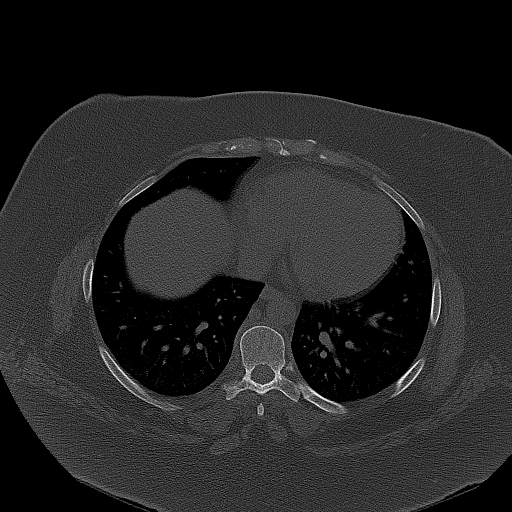
[im 40/56  soft-tissue]
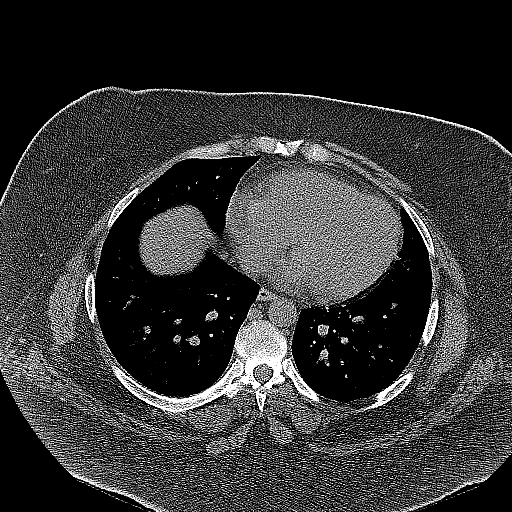
[im 45/56  soft-tissue]
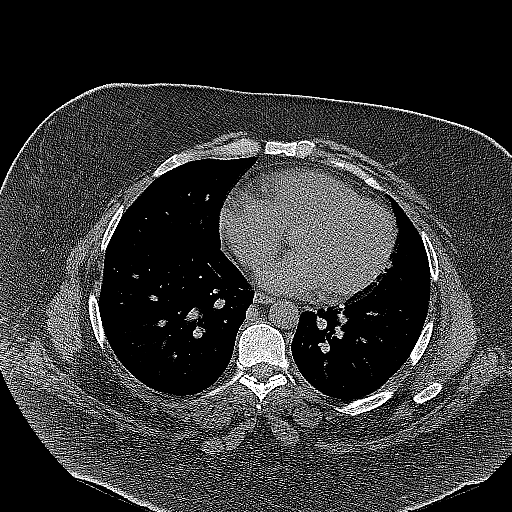
[im 48/56  soft-tissue]
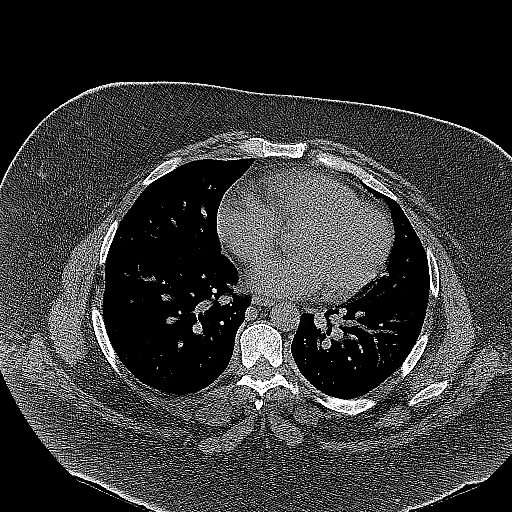
[im 52/56  soft-tissue]
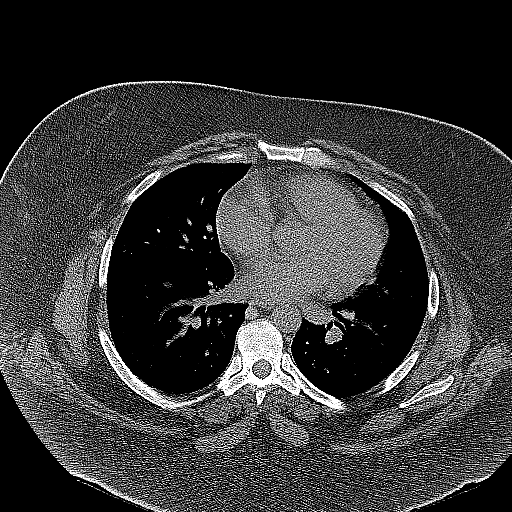

[Series 4: coronal · coronal · 0.80mm/px · 3 of 172 slices shown]
[im 58/172  soft-tissue]
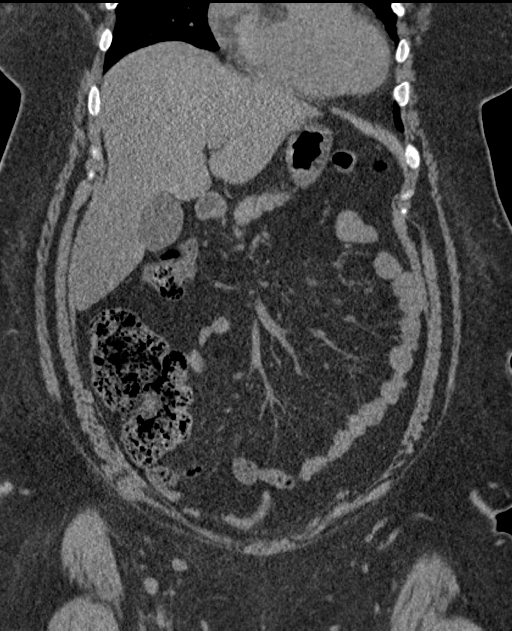
[im 77/172  soft-tissue]
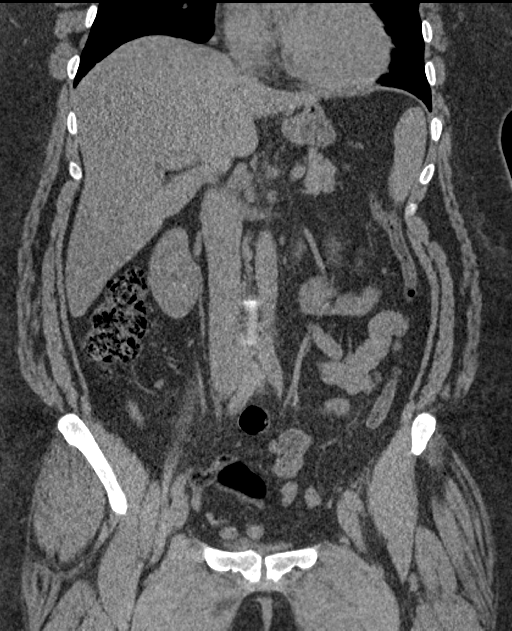
[im 96/172  soft-tissue]
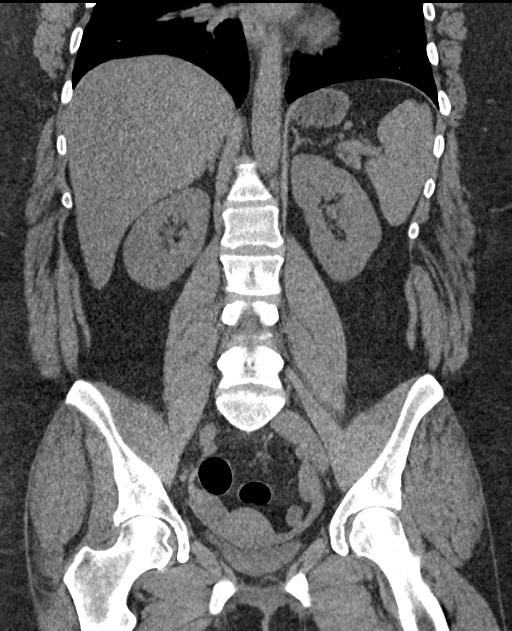

[16 of 46 positions shown; findings below may reference images not displayed]

FINDINGS: Lower chest: No pulmonary nodules or pleural effusion. No visible
pericardial effusion.

Hepatobiliary: Normal noncontrast appearance of the liver. No
visible biliary dilatation. Normal gallbladder.

Pancreas: Normal noncontrast appearance of the pancreas. No
peripancreatic fluid collection.

Spleen: Normal.

Adrenals/Urinary Tract:

--Adrenal glands: Normal.

--Right kidney/ureter: There is an 8 mm stone within the interpolar
right kidney. No hydronephrosis. No ureteral obstruction.

--Left kidney/ureter: No hydronephrosis or perinephric stranding. No
nephrolithiasis. No obstructing ureteral stones.

--Urinary bladder: Unremarkable.

Stomach/Bowel: There is no hiatal hernia. The stomach and duodenum
are normal. There is no dilated small bowel or enteric inflammation.
There is no colonic abnormality. The appendix is normal.

Vascular/Lymphatic: Minimal aortic atherosclerosis. No enlarged or
abnormal density lymph nodes.

Reproductive: Normal uterus and ovaries.

Musculoskeletal. No focal osseous lesion. Normal visualized
extraperitoneal and extrathoracic soft tissues.
IMPRESSION: 1. No obstructive uropathy.
2. 8 mm interpolar right renal calculus without hydronephrosis.
